# Patient Record
Sex: Female | Born: 1950 | Race: White | Hispanic: No | Marital: Married | Smoking: Former smoker
Health system: Southern US, Community
[De-identification: ages and names within clinical notes are randomized; demographics above are authoritative.]

## PROBLEM LIST (undated history)

## (undated) DIAGNOSIS — H04123 Dry eye syndrome of bilateral lacrimal glands: Secondary | ICD-10-CM

## (undated) DIAGNOSIS — T7840XA Allergy, unspecified, initial encounter: Secondary | ICD-10-CM

## (undated) DIAGNOSIS — H269 Unspecified cataract: Secondary | ICD-10-CM

## (undated) DIAGNOSIS — I10 Essential (primary) hypertension: Secondary | ICD-10-CM

## (undated) HISTORY — DX: Dry eye syndrome of bilateral lacrimal glands: H04.123

## (undated) HISTORY — PX: OTHER SURGICAL HISTORY: SHX169

## (undated) HISTORY — DX: Unspecified cataract: H26.9

## (undated) HISTORY — DX: Essential (primary) hypertension: I10

## (undated) HISTORY — DX: Allergy, unspecified, initial encounter: T78.40XA

---

## 2002-01-27 ENCOUNTER — Other Ambulatory Visit: Admission: RE | Admit: 2002-01-27 | Discharge: 2002-01-27 | Payer: Self-pay | Admitting: Obstetrics and Gynecology

## 2003-12-22 ENCOUNTER — Other Ambulatory Visit: Admission: RE | Admit: 2003-12-22 | Discharge: 2003-12-22 | Payer: Self-pay | Admitting: Obstetrics and Gynecology

## 2004-01-25 ENCOUNTER — Encounter: Admission: RE | Admit: 2004-01-25 | Discharge: 2004-01-25 | Payer: Self-pay | Admitting: Obstetrics and Gynecology

## 2004-02-19 ENCOUNTER — Encounter: Admission: RE | Admit: 2004-02-19 | Discharge: 2004-02-19 | Payer: Self-pay | Admitting: Obstetrics and Gynecology

## 2004-09-12 ENCOUNTER — Encounter: Admission: RE | Admit: 2004-09-12 | Discharge: 2004-09-12 | Payer: Self-pay | Admitting: Obstetrics and Gynecology

## 2004-09-21 ENCOUNTER — Ambulatory Visit: Payer: Self-pay | Admitting: Internal Medicine

## 2005-10-02 ENCOUNTER — Encounter: Admission: RE | Admit: 2005-10-02 | Discharge: 2005-10-02 | Payer: Self-pay | Admitting: *Deleted

## 2005-10-17 ENCOUNTER — Ambulatory Visit: Payer: Self-pay | Admitting: Internal Medicine

## 2006-10-22 ENCOUNTER — Encounter: Admission: RE | Admit: 2006-10-22 | Discharge: 2006-10-22 | Payer: Self-pay | Admitting: Obstetrics and Gynecology

## 2010-02-09 ENCOUNTER — Encounter: Admission: RE | Admit: 2010-02-09 | Discharge: 2010-02-09 | Payer: Self-pay | Admitting: Obstetrics and Gynecology

## 2011-04-21 ENCOUNTER — Other Ambulatory Visit: Payer: Self-pay | Admitting: Obstetrics and Gynecology

## 2011-04-21 DIAGNOSIS — Z780 Asymptomatic menopausal state: Secondary | ICD-10-CM

## 2011-08-06 LAB — HM COLONOSCOPY

## 2012-04-08 ENCOUNTER — Other Ambulatory Visit: Payer: Self-pay | Admitting: Obstetrics and Gynecology

## 2012-04-08 DIAGNOSIS — Z1231 Encounter for screening mammogram for malignant neoplasm of breast: Secondary | ICD-10-CM

## 2012-04-21 LAB — HM MAMMOGRAPHY

## 2012-04-26 ENCOUNTER — Other Ambulatory Visit: Payer: Self-pay | Admitting: Obstetrics and Gynecology

## 2012-04-26 DIAGNOSIS — M810 Age-related osteoporosis without current pathological fracture: Secondary | ICD-10-CM

## 2012-05-22 ENCOUNTER — Ambulatory Visit
Admission: RE | Admit: 2012-05-22 | Discharge: 2012-05-22 | Disposition: A | Discharge status: Home / Self Care | Payer: BC Managed Care – PPO | Source: Ambulatory Visit | Attending: Obstetrics and Gynecology | Admitting: Obstetrics and Gynecology

## 2012-05-22 DIAGNOSIS — Z1231 Encounter for screening mammogram for malignant neoplasm of breast: Secondary | ICD-10-CM

## 2012-05-22 DIAGNOSIS — M810 Age-related osteoporosis without current pathological fracture: Secondary | ICD-10-CM

## 2012-06-21 DIAGNOSIS — I10 Essential (primary) hypertension: Secondary | ICD-10-CM

## 2012-06-21 HISTORY — DX: Essential (primary) hypertension: I10

## 2012-07-12 ENCOUNTER — Encounter: Payer: Self-pay | Admitting: Family Medicine

## 2012-07-12 ENCOUNTER — Ambulatory Visit (INDEPENDENT_AMBULATORY_CARE_PROVIDER_SITE_OTHER): Payer: BC Managed Care – PPO | Admitting: Family Medicine

## 2012-07-12 VITALS — BP 138/90 | HR 78 | Temp 98.0°F | Ht 66.0 in | Wt 162.0 lb

## 2012-07-12 DIAGNOSIS — Z0184 Encounter for antibody response examination: Secondary | ICD-10-CM | POA: Insufficient documentation

## 2012-07-12 DIAGNOSIS — I10 Essential (primary) hypertension: Secondary | ICD-10-CM | POA: Insufficient documentation

## 2012-07-12 DIAGNOSIS — IMO0001 Reserved for inherently not codable concepts without codable children: Secondary | ICD-10-CM

## 2012-07-12 DIAGNOSIS — R030 Elevated blood-pressure reading, without diagnosis of hypertension: Secondary | ICD-10-CM

## 2012-07-12 NOTE — Progress Notes (Signed)
  Subjective:    Patient ID: Elaine Lambert, female    DOB: 1951/03/31, 61 y.o.   MRN: [id number]  HPI New to establish.  GYN- Romine.  Last had labs 2 yrs ago.  UTD on mammo, pap.  Has upcoming colonoscopy on 08/05/12.  Elevated BP- pt reports BP was 140/70 last week at GI office.  Denies any hx of HTN.  No family hx of HTN.  No CP, SOB, HAs, visual changes, edema.  Had chicken biscuit this AM.  Shingles vaccine- pt is not sure of her varicella status.   Review of Systems For ROS see HPI     Objective:   Physical Exam  Vitals reviewed. Constitutional: She is oriented to person, place, and time. She appears well-developed and well-nourished. No distress.  HENT:  Head: Normocephalic and atraumatic.  Eyes: Conjunctivae normal and EOM are normal. Pupils are equal, round, and reactive to light.  Neck: Normal range of motion. Neck supple. No thyromegaly present.  Cardiovascular: Normal rate, regular rhythm, normal heart sounds and intact distal pulses.   No murmur heard. Pulmonary/Chest: Effort normal and breath sounds normal. No respiratory distress.  Abdominal: Soft. She exhibits no distension. There is no tenderness.  Musculoskeletal: She exhibits no edema.  Lymphadenopathy:    She has no cervical adenopathy.  Neurological: She is alert and oriented to person, place, and time.  Skin: Skin is warm and dry.  Psychiatric: She has a normal mood and affect. Her behavior is normal.          Assessment & Plan:

## 2012-07-12 NOTE — Assessment & Plan Note (Signed)
New.  Pt w/ no hx of similar.  No family hx.  Asymptomatic.  Will watch BP closely and determine whether meds are required.  Reviewed supportive care and red flags that should prompt return.  Pt expressed understanding and is in agreement w/ plan.

## 2012-07-12 NOTE — Assessment & Plan Note (Signed)
Check Varicellla titers to determine whether pt requires shingles or varicella vaccine.  Pt expressed understanding and is in agreement w/ plan.

## 2012-07-12 NOTE — Patient Instructions (Addendum)
Schedule a nurse visit in 2 weeks to recheck BP We'll notify you of your lab results and determine which vaccine you need Call with any questions or concerns Think of [place] as your home base Welcome!  We're glad to have you! Happy Thanksgiving!

## 2012-07-26 ENCOUNTER — Telehealth: Payer: Self-pay

## 2012-07-26 ENCOUNTER — Ambulatory Visit: Payer: BC Managed Care – PPO

## 2012-07-26 NOTE — Telephone Encounter (Signed)
Advise patient, please schedule a followup with PCP, return immediately if she has headaches, nausea, chest pain, shortness of breath or lower extremity edema.

## 2012-07-26 NOTE — Telephone Encounter (Signed)
LMOVM on number pt [phone] to verify pt pharmacy and advise info. Per Tabori. Awaiting pt call.    MW

## 2012-07-26 NOTE — Telephone Encounter (Signed)
Message copied by Mal Amabile on Fri Jul 26, 2012 10:24 AM ------      Message from: Sheliah Hatch      Created: Fri Jul 26, 2012 10:14 AM       Pt needs a nurse visit encounter, a level 1 charge, and needs to start HCTZ 12.5 mg daily w/ follow up in 1 month w/ me.  Selena Batten- please show Hilda Lias how to do a nurse visit if she doesn't know how.  Not sure she has done one previously.            Thanks      KT      ----- Message -----         From: Mal Amabile, MA         Sent: 07/26/2012  10:07 AM           To: Sheliah Hatch, MD            Pt is here for BP check on nurses visit. B/P 150/98. PLz advise what I need to do next and if you want pt charged for it.     MW

## 2012-07-26 NOTE — Telephone Encounter (Signed)
This patient has an apt scheduled on Monday with Dr.Tabori and Dr.Tabori is aware.     KP

## 2012-07-26 NOTE — Telephone Encounter (Signed)
Spoke to pt and pt refusing BP meds at this time advises she is under a lot of stress and will try to eliminate some of that and see if that helps BP.  Pt needs office visit with Tabori to discuss BP. PLz advise pt CB# [phone]

## 2012-07-29 ENCOUNTER — Encounter: Payer: Self-pay | Admitting: Family Medicine

## 2012-07-29 ENCOUNTER — Ambulatory Visit (INDEPENDENT_AMBULATORY_CARE_PROVIDER_SITE_OTHER): Payer: BC Managed Care – PPO | Admitting: Family Medicine

## 2012-07-29 VITALS — BP 158/90 | HR 72 | Temp 98.3°F | Ht 66.25 in | Wt 162.8 lb

## 2012-07-29 DIAGNOSIS — R030 Elevated blood-pressure reading, without diagnosis of hypertension: Secondary | ICD-10-CM

## 2012-07-29 DIAGNOSIS — IMO0001 Reserved for inherently not codable concepts without codable children: Secondary | ICD-10-CM

## 2012-07-29 DIAGNOSIS — Z2911 Encounter for prophylactic immunotherapy for respiratory syncytial virus (RSV): Secondary | ICD-10-CM

## 2012-07-29 DIAGNOSIS — Z23 Encounter for immunization: Secondary | ICD-10-CM

## 2012-07-29 MED ORDER — HYDROCHLOROTHIAZIDE 12.5 MG PO CAPS: 12.5000 mg | ORAL_CAPSULE | Freq: Every day | ORAL | Status: DC

## 2012-07-29 NOTE — Progress Notes (Signed)
  Subjective:    Patient ID: Elaine Lambert, female    DOB: 07-13-1951, 61 y.o.   MRN: [id number]  HPI HTN- new dx.  + family hx- mom.  Mom stopped taking meds due to side effects.  Pt feels this is stress related.  No CP.  Will have mild SOB when 'upset about something'.  No HAs, visual changes.  Pt hesitant to take meds due to mom's side effects- pt not aware of what med mom was on.  Pt not following low salt diet.  Varicella titers- indicate pt has had past infection w/ varicella, desires shingles shot.   Review of Systems For ROS see HPI     Objective:   Physical Exam  Vitals reviewed. Constitutional: She is oriented to person, place, and time. She appears well-developed and well-nourished. No distress.  HENT:  Head: Normocephalic and atraumatic.  Eyes: Conjunctivae normal and EOM are normal. Pupils are equal, round, and reactive to light.  Neck: Normal range of motion. Neck supple. No thyromegaly present.  Cardiovascular: Normal rate, regular rhythm, normal heart sounds and intact distal pulses.   No murmur heard. Pulmonary/Chest: Effort normal and breath sounds normal. No respiratory distress.  Abdominal: Soft. She exhibits no distension. There is no tenderness.  Musculoskeletal: She exhibits no edema.  Lymphadenopathy:    She has no cervical adenopathy.  Neurological: She is alert and oriented to person, place, and time.  Skin: Skin is warm and dry.  Psychiatric: She has a normal mood and affect. Her behavior is normal.          Assessment & Plan:

## 2012-07-29 NOTE — Patient Instructions (Addendum)
Follow up in 1 month to recheck blood pressure and potassium Increase your dietary intake of potassium in the form of leafy greens, bananas, citrus fruits Try and find a stress outlet! Limit your salt intake Call with any questions or concerns Happy Holidays!!!

## 2012-07-29 NOTE — Assessment & Plan Note (Signed)
New dx.  Based on pt's hx of elevated BP on multiple occasions she is officially hypertensive.  Discussed this w/ pt and need for BP control to avoid long term complications.  Pt agreeable.  Will start low dose HCTZ and follow closely.

## 2012-08-30 ENCOUNTER — Ambulatory Visit (INDEPENDENT_AMBULATORY_CARE_PROVIDER_SITE_OTHER): Payer: BC Managed Care – PPO | Admitting: Family Medicine

## 2012-08-30 ENCOUNTER — Encounter: Payer: Self-pay | Admitting: Family Medicine

## 2012-08-30 VITALS — BP 110/80 | HR 82 | Temp 98.2°F | Ht 66.5 in | Wt 162.8 lb

## 2012-08-30 DIAGNOSIS — I10 Essential (primary) hypertension: Secondary | ICD-10-CM

## 2012-08-30 LAB — BASIC METABOLIC PANEL
BUN: 14 mg/dL (ref 6–23)
Calcium: 9.4 mg/dL (ref 8.4–10.5)
GFR: 84.1 mL/min (ref >60.00)
Glucose, Bld: 106 mg/dL — ABNORMAL HIGH (ref 70–99)
Potassium: 3.7 mEq/L (ref 3.5–5.1)
Sodium: 137 mEq/L (ref 135–145)

## 2012-08-30 NOTE — Patient Instructions (Addendum)
Schedule your complete physical in 3 months Keep up the good work- you look great! Call with any questions or concerns Happy New Year!

## 2012-08-30 NOTE — Progress Notes (Signed)
  Subjective:    Patient ID: Elaine Lambert, female    DOB: [birth year], 62 y.o.   MRN: [id number]  HPI HTN- relatively new dx for pt.  Started on HCTZ last visit.  SBP has decreased by 10 pts.  Had colonoscopy f/u at [facility] 2 weeks ago and BP was 128/82.  No CP, SOB, HAs, visual changes, edema.   Review of Systems For ROS see HPI     Objective:   Physical Exam  Vitals reviewed. Constitutional: She is oriented to person, place, and time. She appears well-developed and well-nourished. No distress.  HENT:  Head: Normocephalic and atraumatic.  Eyes: Conjunctivae normal and EOM are normal. Pupils are equal, round, and reactive to light.  Neck: Normal range of motion. Neck supple. No thyromegaly present.  Cardiovascular: Normal rate, regular rhythm, normal heart sounds and intact distal pulses.   No murmur heard. Pulmonary/Chest: Effort normal and breath sounds normal. No respiratory distress.  Abdominal: Soft. She exhibits no distension. There is no tenderness.  Musculoskeletal: She exhibits no edema.  Lymphadenopathy:    She has no cervical adenopathy.  Neurological: She is alert and oriented to person, place, and time.  Skin: Skin is warm and dry.  Psychiatric: She has a normal mood and affect. Her behavior is normal.          Assessment & Plan:

## 2012-08-30 NOTE — Assessment & Plan Note (Signed)
BP much improved since last visit.  Check BMP.  Continue HCTZ.  Pt expressed understanding and is in agreement w/ plan.

## 2012-11-10 ENCOUNTER — Encounter: Payer: Self-pay | Admitting: Family Medicine

## 2012-11-11 ENCOUNTER — Ambulatory Visit (INDEPENDENT_AMBULATORY_CARE_PROVIDER_SITE_OTHER): Payer: BC Managed Care – PPO | Admitting: Family Medicine

## 2012-11-11 ENCOUNTER — Encounter: Payer: Self-pay | Admitting: Family Medicine

## 2012-11-11 VITALS — BP 120/78 | HR 80 | Temp 98.3°F | Ht 66.5 in | Wt 164.8 lb

## 2012-11-11 DIAGNOSIS — L03319 Cellulitis of trunk, unspecified: Secondary | ICD-10-CM

## 2012-11-11 DIAGNOSIS — L03313 Cellulitis of chest wall: Secondary | ICD-10-CM

## 2012-11-11 DIAGNOSIS — L02219 Cutaneous abscess of trunk, unspecified: Secondary | ICD-10-CM

## 2012-11-11 DIAGNOSIS — IMO0002 Reserved for concepts with insufficient information to code with codable children: Secondary | ICD-10-CM | POA: Insufficient documentation

## 2012-11-11 MED ORDER — DOXYCYCLINE HYCLATE 100 MG PO TABS: 100.0000 mg | ORAL_TABLET | Freq: Two times a day (BID) | ORAL | Status: DC

## 2012-11-11 NOTE — Progress Notes (Signed)
  Subjective:    Patient ID: Elaine Lambert, female    DOB: 02/08/1951, 63 y.o.   MRN: [id number]  HPI Lump on chest- pt reports she had a 'swollen gland' in the center of the chest 'for a long time' but as of Saturday, area has enlarged, become painful and red/hot.  No fevers.  No drainage from chest.   Review of Systems For ROS see HPI     Objective:   Physical Exam  Vitals reviewed. Constitutional: She appears well-developed and well-nourished. No distress.  Skin: Skin is warm and dry. There is erythema (large area of erythema starting over mid sternum between breasts and extending L to encompass ~ 4 inches of L breast.  warm to touch, induration just L of sternum w/out fluctuance or drainage).          Assessment & Plan:

## 2012-11-11 NOTE — Telephone Encounter (Signed)
Patient was seen today.

## 2012-11-11 NOTE — Patient Instructions (Addendum)
This is called cellulitis (infection) Apply hot compresses to the area Take the Doxy twice daily- take w/ food If no improvement in the next few days or worsening- please call!! Hang in there!

## 2012-11-11 NOTE — Assessment & Plan Note (Signed)
New.  Pt w/out drainable fluid collection.  Start abx.  Take w/ food.  Reviewed supportive care and red flags that should prompt return.  Pt expressed understanding and is in agreement w/ plan.

## 2012-11-13 ENCOUNTER — Encounter: Payer: Self-pay | Admitting: Family Medicine

## 2012-11-13 ENCOUNTER — Telehealth: Payer: Self-pay | Admitting: Family Medicine

## 2012-11-13 NOTE — Telephone Encounter (Signed)
Patient Information:  Caller Name: Joscelyne  Phone: [phone]  Patient: Elaine Lambert, Elaine Lambert  Gender: Female  DOB: 12/24/1950  Age: 62 Years  PCP: Sheliah Hatch.  Office Follow Up:  Does the office need to follow up with this patient?: No  Instructions For The Office: N/A  RN Note:  Swollen area in center of chest is not as red. Remains tender. Is taking Tylenol as diected. Advised return if pain worsening or not improved after 48hours on antibiotic. Wants to know if should change to saline soaks and advised to follow MD advice and use warm compresses. Will call back 3-27 if not improved or worsening.  Symptoms  Reason For Call & Symptoms: Was seen in office 3-24 and diagnosed with swollen "gland" in center of chest. Area has opened and is draining  "clearish brown" and has small amount of blood in drainage.  Reviewed Health History In EMR: Yes  Reviewed Medications In EMR: Yes  Reviewed Allergies In EMR: Yes  Reviewed Surgeries / Procedures: Yes  Date of Onset of Symptoms: 11/13/2012  Guideline(s) Used:  Skin Lesion - Moles or Growths  Lymph Nodes - Swollen  Disposition Per Guideline:   See Today or Tomorrow in Office  Reason For Disposition Reached:   Very tender to the touch but no fever  Advice Given:  Call Back If:  You become worse or are worried about a lymph node.  Patient Will Follow Care Advice:  YES

## 2012-11-13 NOTE — Telephone Encounter (Signed)
Drainage is good!  Less redness is good!  These things all sound promising

## 2012-11-13 NOTE — Telephone Encounter (Signed)
To MD for review     KP 

## 2012-11-15 ENCOUNTER — Telehealth (INDEPENDENT_AMBULATORY_CARE_PROVIDER_SITE_OTHER): Payer: Self-pay

## 2012-11-15 ENCOUNTER — Encounter (INDEPENDENT_AMBULATORY_CARE_PROVIDER_SITE_OTHER): Payer: Self-pay

## 2012-11-15 ENCOUNTER — Ambulatory Visit (INDEPENDENT_AMBULATORY_CARE_PROVIDER_SITE_OTHER): Payer: BC Managed Care – PPO | Admitting: General Surgery

## 2012-11-15 ENCOUNTER — Encounter: Payer: Self-pay | Admitting: Family Medicine

## 2012-11-15 ENCOUNTER — Telehealth (INDEPENDENT_AMBULATORY_CARE_PROVIDER_SITE_OTHER): Payer: Self-pay | Admitting: *Deleted

## 2012-11-15 ENCOUNTER — Ambulatory Visit (INDEPENDENT_AMBULATORY_CARE_PROVIDER_SITE_OTHER): Payer: BC Managed Care – PPO | Admitting: Family Medicine

## 2012-11-15 ENCOUNTER — Encounter (INDEPENDENT_AMBULATORY_CARE_PROVIDER_SITE_OTHER): Payer: Self-pay | Admitting: General Surgery

## 2012-11-15 VITALS — BP 126/80 | HR 77 | Temp 98.2°F | Ht 66.5 in | Wt 163.4 lb

## 2012-11-15 VITALS — BP 118/72 | HR 68 | Temp 98.3°F | Resp 18 | Ht 66.5 in | Wt 163.0 lb

## 2012-11-15 DIAGNOSIS — L03313 Cellulitis of chest wall: Secondary | ICD-10-CM

## 2012-11-15 DIAGNOSIS — N61 Mastitis without abscess: Secondary | ICD-10-CM

## 2012-11-15 DIAGNOSIS — L02219 Cutaneous abscess of trunk, unspecified: Secondary | ICD-10-CM

## 2012-11-15 HISTORY — PX: INCISION AND DRAINAGE BREAST ABSCESS: SUR672

## 2012-11-15 MED ORDER — DOXYCYCLINE HYCLATE 100 MG PO TABS: 100.0000 mg | ORAL_TABLET | Freq: Two times a day (BID) | ORAL | Status: DC

## 2012-11-15 NOTE — Assessment & Plan Note (Addendum)
Deteriorated.  Pt w/ 4-5 inches of erythema and induration w/ 2.5 inches of central peeling skin, necrotic center, and copious foul smelling drainage despite ongoing Doxy.  Skin has some orange skin like changes- must r/o breast cancer variant.  Referral made for urgent surgical evaluation.

## 2012-11-15 NOTE — Telephone Encounter (Signed)
Rx for Norco 5/325 #30 w/ no refills called to [facility] in [place], [place]/[address] [place] location.  Pt is aware.

## 2012-11-15 NOTE — Telephone Encounter (Signed)
Pt was seen in the office today by Dr. Beverely Low to follow-up on the Cellulitis of the chest wall.//AB/CMA

## 2012-11-15 NOTE — Telephone Encounter (Signed)
Urgent office appt made per Dr. Beverely Low.

## 2012-11-15 NOTE — Progress Notes (Signed)
  Subjective:    Patient ID: Elaine Lambert, female    DOB: 1951/02/12, 62 y.o.   MRN: [id number]  HPI Breast abscess- L, seen on Monday and started on Doxy for cellulitis.  No abscess at that time.  Now area is more tender, draining- foul smelling green and brown, and pt woke w/ black central area.  No fevers.  Taking Doxy as directed.   Review of Systems For ROS see HPI     Objective:   Physical Exam  Vitals reviewed. Constitutional: She appears well-developed and well-nourished.  Obviously uncomfortable  Skin: Skin is warm. There is erythema (pt w/ 4-5 inches of erythema extending from sternum across L breast, 2.5 inch area of peeling, wet, draining skin w/ central area of necrosis.  gentle pressure applied to indurated area and copious drainage expressed- brownish, green, extremely foul odor.).  As much drainage expressed as possible in office w/out making incision          Assessment & Plan:

## 2012-11-15 NOTE — Patient Instructions (Signed)
Remove bandage and packing on Sunday. Clean area with warm water twice a day and apply a heavy dressing. If you have any problems this weekend, go to the emergency department.

## 2012-11-15 NOTE — Progress Notes (Signed)
Patient ID: Mairim Bade, female   DOB: 03/19/1951, 63 y.o.   MRN: [id number]  Chief Complaint  Patient presents with  . Other    Eval breast abscess    HPI Toshia Larkin is a 62 y.o. female.   HPI  She is referred by Dr. Beverely Low for further evaluation of a left breast abscess.  About 3-4 months ago she noticed a small bump in the medial aspect of the left breast. She pressed on this and had some drainage from this, and it improved. One month ago it recurred. About 6 days ago she noted a coming back and redness started streaking from medial to lateral on the breast. She saw her primary care physician 4 days ago and was started on doxycycline. Wednesday he began spontaneously draining and slightly improving. It has continued to drain.  Past Medical History  Diagnosis Date  . Hypertension     History reviewed. No pertinent past surgical history.  Family History  Problem Relation Age of Onset  . Cancer Mother     SINUS  . Parkinson's disease Mother   . Lung cancer Father   . Liver cancer Father   . Testicular cancer Brother     Social History History  Substance Use Topics  . Smoking status: Never Smoker   . Smokeless tobacco: Not on file  . Alcohol Use: No    No Known Allergies  Current Outpatient Prescriptions  Medication Sig Dispense Refill  . doxycycline (VIBRA-TABS) 100 MG tablet Take 1 tablet (100 mg total) by mouth 2 (two) times daily.  20 tablet  0  . hydrochlorothiazide (MICROZIDE) 12.5 MG capsule Take 1 capsule (12.5 mg total) by mouth daily.  30 capsule  3   No current facility-administered medications for this visit.    Review of Systems Review of Systems  Constitutional: Negative for fever and chills.  Respiratory:       Less redness on the breast now.    Blood pressure 118/72, pulse 68, temperature 98.3 F (36.8 C), temperature source Temporal, resp. rate 18, height 5' 6.5" (1.689 m), weight 163 lb (73.936 kg).  Physical Exam Physical Exam   Constitutional: She appears well-developed and well-nourished. No distress.  Pulmonary/Chest:  Left breast demonstrates erythema over the medial half. There is some necrotic skin adjacent to the sternal area with a scab present. Induration is present around this area as well.    Data Reviewed Dr. Rennis Golden note  Assessment    Incompletely drained left breast abscess with cellulitis.     Plan    Incision and drainage here in the office.  The medial aspect of the left breast was sterilely prepped and draped. The area of induration was anesthetized with Xylocaine. A circular incision was made through some necrotic skin and some purulent fluid evacuated. Necrotic tissue was sharply debrided. The wound was then packed tightly with iodoform gauze. A bulky dressing was applied. She tolerated the procedure well.  She was instructed to remove the packing on Sunday and clean the wound twice daily with warm water and apply a bulky dressing. She is to continue the doxycycline. Return visit one week.        Tiquan Bouch J 11/15/2012, 4:38 PM

## 2012-11-20 ENCOUNTER — Telehealth: Payer: Self-pay | Admitting: Family Medicine

## 2012-11-20 ENCOUNTER — Ambulatory Visit (INDEPENDENT_AMBULATORY_CARE_PROVIDER_SITE_OTHER): Payer: BC Managed Care – PPO | Admitting: General Surgery

## 2012-11-20 ENCOUNTER — Encounter (INDEPENDENT_AMBULATORY_CARE_PROVIDER_SITE_OTHER): Payer: Self-pay | Admitting: General Surgery

## 2012-11-20 VITALS — BP 130/80 | HR 72 | Temp 97.6°F | Resp 18 | Ht 66.5 in | Wt 165.4 lb

## 2012-11-20 DIAGNOSIS — Z9889 Other specified postprocedural states: Secondary | ICD-10-CM

## 2012-11-20 NOTE — Telephone Encounter (Signed)
Refill: Hydrochlorothiazide 12.5 mg capsules. Take one capsule by mouth every day. Qty 30. Last fill 10-22-12

## 2012-11-20 NOTE — Progress Notes (Signed)
She is here for her first visit after incision and drainage of left breast abscess. She feels much better. She says the redness is about gone.  On exam, the left breast medial wound is clean. The erythema is nearly completely resolved. No induration.  Left breast abscess status post incision and drainage-significantly improved.  Plan: Continue current wound care. Continued antibiotics until they are gone. Return visit 3 weeks.

## 2012-11-20 NOTE — Patient Instructions (Signed)
Continue current wound care.  Call if the redness returns.

## 2012-11-21 MED ORDER — HYDROCHLOROTHIAZIDE 12.5 MG PO CAPS: 12.5000 mg | ORAL_CAPSULE | Freq: Every day | ORAL | Status: DC

## 2012-11-21 NOTE — Telephone Encounter (Signed)
Refill done.  

## 2012-12-02 ENCOUNTER — Ambulatory Visit (INDEPENDENT_AMBULATORY_CARE_PROVIDER_SITE_OTHER): Payer: BC Managed Care – PPO | Admitting: Family Medicine

## 2012-12-02 ENCOUNTER — Encounter: Payer: Self-pay | Admitting: Family Medicine

## 2012-12-02 VITALS — BP 138/80 | HR 64 | Temp 98.3°F | Ht 65.75 in | Wt 164.2 lb

## 2012-12-02 DIAGNOSIS — Z0000 Encounter for general adult medical examination without abnormal findings: Secondary | ICD-10-CM

## 2012-12-02 LAB — HEPATIC FUNCTION PANEL
ALT: 15 U/L (ref 0–35)
AST: 18 U/L (ref 0–37)
Albumin: 4.1 g/dL (ref 3.5–5.2)
Alkaline Phosphatase: 61 U/L (ref 39–117)
Bilirubin, Direct: 0.1 mg/dL (ref 0.0–0.3)
Total Protein: 7.1 g/dL (ref 6.0–8.3)

## 2012-12-02 LAB — BASIC METABOLIC PANEL
CO2: 25 mEq/L (ref 19–32)
Chloride: 104 mEq/L (ref 96–112)
Glucose, Bld: 106 mg/dL — ABNORMAL HIGH (ref 70–99)
Potassium: 3.9 mEq/L (ref 3.5–5.1)
Sodium: 137 mEq/L (ref 135–145)

## 2012-12-02 LAB — CBC WITH DIFFERENTIAL/PLATELET
Basophils Absolute: 0 10*3/uL (ref 0.0–0.1)
Eosinophils Relative: 4.6 % (ref 0.0–5.0)
HCT: 40.8 % (ref 36.0–46.0)
Lymphocytes Relative: 24.6 % (ref 12.0–46.0)
Lymphs Abs: 1.4 10*3/uL (ref 0.7–4.0)
Monocytes Relative: 7.2 % (ref 3.0–12.0)
Neutrophils Relative %: 62.7 % (ref 43.0–77.0)
Platelets: 215 10*3/uL (ref 150.0–400.0)
RDW: 12.6 % (ref 11.5–14.6)
WBC: 5.7 10*3/uL (ref 4.5–10.5)

## 2012-12-02 LAB — LIPID PANEL
LDL Cholesterol: 120 mg/dL — ABNORMAL HIGH (ref 0–99)
Total CHOL/HDL Ratio: 3
Triglycerides: 71 mg/dL (ref 0.0–149.0)

## 2012-12-02 NOTE — Assessment & Plan Note (Signed)
Pt's PE WNL.  UTD on health maintenance.  Check labs.  Anticipatory guidance provided.  

## 2012-12-02 NOTE — Progress Notes (Signed)
  Subjective:    Patient ID: Elaine Lambert, female    DOB: [birth year], 62 y.o.   MRN: [id number]  HPI CPE- UTD on health maintenance.  No concerns today.   Review of Systems Patient reports no vision/ hearing changes, adenopathy,fever, weight change,  persistant/recurrent hoarseness , swallowing issues, chest pain, palpitations, edema, persistant/recurrent cough, hemoptysis, dyspnea (rest/exertional/paroxysmal nocturnal), gastrointestinal bleeding (melena, rectal bleeding), abdominal pain, significant heartburn, bowel changes, GU symptoms (dysuria, hematuria, incontinence), Gyn symptoms (abnormal  bleeding, pain),  syncope, focal weakness, memory loss, numbness & tingling, skin/hair/nail changes, abnormal bruising or bleeding, anxiety, or depression.     Objective:   Physical Exam General Appearance:    Alert, cooperative, no distress, appears stated age  Head:    Normocephalic, without obvious abnormality, atraumatic  Eyes:    PERRL, conjunctiva/corneas clear, EOM's intact, fundi    benign, both eyes  Ears:    Normal TM's and external ear canals, both ears  Nose:   Nares normal, septum midline, mucosa normal, no drainage    or sinus tenderness  Throat:   Lips, mucosa, and tongue normal; teeth and gums normal  Neck:   Supple, symmetrical, trachea midline, no adenopathy;    Thyroid: no enlargement/tenderness/nodules  Back:     Symmetric, no curvature, ROM normal, no CVA tenderness  Lungs:     Clear to auscultation bilaterally, respirations unlabored  Chest Wall:    No tenderness or deformity   Heart:    Regular rate and rhythm, S1 and S2 normal, no murmur, rub   or gallop  Breast Exam:    Deferred to GYN  Abdomen:     Soft, non-tender, bowel sounds active all four quadrants,    no masses, no organomegaly  Genitalia:    Deferred to GYN  Rectal:    Extremities:   Extremities normal, atraumatic, no cyanosis or edema  Pulses:   2+ and symmetric all extremities  Skin:   Skin color,  texture, turgor normal, no rashes or lesions.  Well healing chest wall abscess  Lymph nodes:   Cervical, supraclavicular, and axillary nodes normal  Neurologic:   CNII-XII intact, normal strength, sensation and reflexes    throughout          Assessment & Plan:

## 2012-12-02 NOTE — Patient Instructions (Addendum)
Follow up in 6 months to recheck BP We'll notify you of your lab results and make any changes if needed Keep up the good work!  You look great! Call with any questions or concerns Happy Spring! 

## 2012-12-06 LAB — VITAMIN D 1,25 DIHYDROXY
Vitamin D 1, 25 (OH)2 Total: 58 pg/mL (ref 18–72)
Vitamin D2 1, 25 (OH)2: <8 pg/mL

## 2012-12-09 ENCOUNTER — Encounter (INDEPENDENT_AMBULATORY_CARE_PROVIDER_SITE_OTHER): Payer: Self-pay | Admitting: General Surgery

## 2012-12-09 ENCOUNTER — Ambulatory Visit (INDEPENDENT_AMBULATORY_CARE_PROVIDER_SITE_OTHER): Payer: BC Managed Care – PPO | Admitting: General Surgery

## 2012-12-09 VITALS — BP 130/88 | HR 79 | Temp 97.2°F | Ht 66.0 in | Wt 164.2 lb

## 2012-12-09 DIAGNOSIS — N611 Abscess of the breast and nipple: Secondary | ICD-10-CM

## 2012-12-09 DIAGNOSIS — N61 Mastitis without abscess: Secondary | ICD-10-CM

## 2012-12-09 NOTE — Patient Instructions (Signed)
If the wound has not healed in one month please call and let us know.

## 2012-12-09 NOTE — Progress Notes (Signed)
She is here for her second visit after incision and drainage of left breast abscess.  She is doing well.  On exam, the left breast medial wound is almost completely healed.  No erythema or induration.  Left breast abscess status post incision and drainage-wound almost completely healed.  Plan:  Apply a dry bandage daily.  Return as needed if wound fails to completely heal.

## 2013-01-24 ENCOUNTER — Encounter: Payer: Self-pay | Admitting: Family Medicine

## 2013-01-24 ENCOUNTER — Ambulatory Visit (INDEPENDENT_AMBULATORY_CARE_PROVIDER_SITE_OTHER): Payer: BC Managed Care – PPO | Admitting: Family Medicine

## 2013-01-24 VITALS — BP 138/78 | HR 77 | Temp 98.5°F | Ht 65.75 in | Wt 164.4 lb

## 2013-01-24 DIAGNOSIS — S0180XA Unspecified open wound of other part of head, initial encounter: Secondary | ICD-10-CM

## 2013-01-24 DIAGNOSIS — K120 Recurrent oral aphthae: Secondary | ICD-10-CM

## 2013-01-24 DIAGNOSIS — S0181XA Laceration without foreign body of other part of head, initial encounter: Secondary | ICD-10-CM | POA: Insufficient documentation

## 2013-01-24 NOTE — Assessment & Plan Note (Signed)
New to provider, sutures have approximated wound edges very well and area is healing.  Sutures removed w/out difficulty.  Encouraged pt to continue neosporin and avoid sun exposure.  Reviewed supportive care and red flags that should prompt return.  Pt expressed understanding and is in agreement w/ plan.

## 2013-01-24 NOTE — Patient Instructions (Addendum)
This looks good! Apply Neosporin (polysporin) twice daily If out in the sun, keep cover or use good, high SPF sunscreen (raw skin will burn easily!) Get the aphthous ulcer or canker sore cream and apply as directed Call with any questions or concerns Hang in there!!

## 2013-01-24 NOTE — Progress Notes (Signed)
  Subjective:    Patient ID: Elaine Lambert, female    DOB: 1950-12-23, 62 y.o.   MRN: [id number]  HPI Facial laceration- pt fell off the steps on the deck and her sunglasses caused a laceration above her L eye requiring 11 stitches in the ER.  Also w/ facial abrasion on chin and 2 oral sores from biting her lip.  Was previously applying Neosporin to the abraded areas but stopped after 3 days.  No longer oozing or draining and currently mouth areas are most painful.  Stitches due to be removed today.   Review of Systems For ROS see HPI     Objective:   Physical Exam  Vitals reviewed. Constitutional: She appears well-developed and well-nourished. No distress.  HENT:  2 aphthous ulcers- 1 on bottom lip and one on upper lip where pt bit when she fell.  No evidence of infection  Skin: Skin is warm and dry.  Superficial abrasion on chin w/out evidence of infxn L facial laceration above medial L eye w/out evidence of infxn or drainage, well approximated- 11 sutures in place.  Sutures removed w/out difficulty and wound cleaned w/ H2O2.          Assessment & Plan:

## 2013-01-24 NOTE — Assessment & Plan Note (Signed)
New.  No evidence of infxn.  Encouraged pt to apply OTC paste for pain relief.  Pt expressed understanding and is in agreement w/ plan.

## 2013-04-02 ENCOUNTER — Other Ambulatory Visit: Payer: Self-pay | Admitting: Family Medicine

## 2013-04-02 NOTE — Telephone Encounter (Signed)
Rx sent to the pharmacy by e-script.//AB/CMA 

## 2013-04-28 ENCOUNTER — Ambulatory Visit (INDEPENDENT_AMBULATORY_CARE_PROVIDER_SITE_OTHER): Payer: BC Managed Care – PPO | Admitting: Nurse Practitioner

## 2013-04-28 ENCOUNTER — Encounter: Payer: Self-pay | Admitting: Nurse Practitioner

## 2013-04-28 VITALS — BP 120/84 | HR 64 | Resp 16 | Ht 66.5 in | Wt 165.0 lb

## 2013-04-28 DIAGNOSIS — Z01419 Encounter for gynecological examination (general) (routine) without abnormal findings: Secondary | ICD-10-CM

## 2013-04-28 DIAGNOSIS — Z0000 Encounter for general adult medical examination without abnormal findings: Secondary | ICD-10-CM

## 2013-04-28 LAB — POCT URINALYSIS DIPSTICK
Bilirubin, UA: NEGATIVE
Blood, UA: NEGATIVE
Leukocytes, UA: NEGATIVE
Nitrite, UA: NEGATIVE
Protein, UA: NEGATIVE
pH, UA: 5

## 2013-04-28 NOTE — Progress Notes (Signed)
Patient ID: Elaine Lambert, female   DOB: 21-Feb-1951, 62 y.o.   MRN: [id number] 62 y.o. G52P1001 Married Caucasian Fe here for annual exam.  New diagnosis of HTN last fall.  She also had a cyst between the breast that became an abscess and had to be surgical excised in April. Has done well with that.  No LMP recorded. Patient is postmenopausal.          Sexually active: yes  The current method of family planning is post menopausal status.    Exercising: yes  Home exercise routine includes walking and treadmill. Smoker:  no  Health Maintenance: Pap:  04/26/12, WNL HR HPV neg MMG:  05/22/12, BI-Rads 1: negative  Colonoscopy:  10/2012, [facility], 3 polyps, repeat in 5 years BMD:   05/22/12, normal TDaP:  04/21/11 Labs: HB; 14.4 Urine: negative    reports that she has never smoked. She has never used smokeless tobacco. She reports that she drinks about 0.5 ounces of alcohol per week. She reports that she does not use illicit drugs.  Past Medical History  Diagnosis Date  . Hypertension     Past Surgical History  Procedure Laterality Date  . Incision and drainage breast abscess Left 11/2012    Current Outpatient Prescriptions  Medication Sig Dispense Refill  . hydrochlorothiazide (MICROZIDE) 12.5 MG capsule TAKE 1 CAPSULE BY MOUTH EVERY DAY  30 capsule  2   No current facility-administered medications for this visit.    Family History  Problem Relation Age of Onset  . Cancer Mother     SINUS, retinal  . Parkinson's disease Mother   . Lung cancer Father   . Liver cancer Father   . Testicular cancer Brother   . Prostate cancer Brother     ROS:  Pertinent items are noted in HPI.  Otherwise, a comprehensive ROS was negative.  Exam:   BP 120/84  Pulse 64  Resp 16  Ht 5' 6.5" (1.689 m)  Wt 165 lb (74.844 kg)  BMI 26.24 kg/m2 Height: 5' 6.5" (168.9 cm)  Ht Readings from Last 3 Encounters:  04/28/13 5' 6.5" (1.689 m)  01/24/13 5' 5.75" (1.67 m)  12/09/12 5\' 6"  (1.676 m)     General appearance: alert, cooperative and appears stated age Head: Normocephalic, without obvious abnormality, atraumatic Neck: no adenopathy, supple, symmetrical, trachea midline and thyroid normal to inspection and palpation Lungs: clear to auscultation bilaterally Breasts: normal appearance, no masses or tenderness Heart: regular rate and rhythm Abdomen: soft, non-tender; no masses,  no organomegaly Extremities: extremities normal, atraumatic, no cyanosis or edema Skin: Skin color, texture, turgor normal. No rashes or lesions Lymph nodes: Cervical, supraclavicular, and axillary nodes normal. No abnormal inguinal nodes palpated Neurologic: Grossly normal   Pelvic: External genitalia:  no lesions              Urethra:  normal appearing urethra with no masses, tenderness or lesions              Bartholin's and Skene's: normal                 Vagina: normal appearing vagina with normal color and discharge, no lesions              Cervix: anteverted              Pap taken: no Bimanual Exam:  Uterus:  normal size, contour, position, consistency, mobility, non-tender  Adnexa: no mass, fullness, tenderness               Rectovaginal: Confirms               Anus:  normal sphincter tone, no lesions  A:  Well Woman with normal exam  Postmenopausal no HRT  History of Colonic polyps 2014  New diagnosis of HTN  P:   Pap smear as per guidelines  not done  Mammogram due 10/14  Counseled on breast self exam, adequate intake of calcium and vitamin D, diet and exercise return annually or prn  An After Visit Summary was printed and given to the patient.

## 2013-04-28 NOTE — Patient Instructions (Signed)

## 2013-05-01 NOTE — Progress Notes (Signed)
Encounter reviewed by Dr. Corlis Angelica Silva.  

## 2013-06-26 ENCOUNTER — Other Ambulatory Visit: Payer: Self-pay

## 2013-06-27 ENCOUNTER — Other Ambulatory Visit: Payer: Self-pay | Admitting: Family Medicine

## 2013-06-27 NOTE — Telephone Encounter (Signed)
Med filled.  

## 2013-07-01 ENCOUNTER — Encounter: Payer: Self-pay | Admitting: Nurse Practitioner

## 2013-12-10 ENCOUNTER — Telehealth: Payer: Self-pay | Admitting: Nurse Practitioner

## 2013-12-10 ENCOUNTER — Ambulatory Visit
Admission: RE | Admit: 2013-12-10 | Discharge: 2013-12-10 | Disposition: A | Discharge status: Home / Self Care | Payer: BC Managed Care – PPO | Source: Ambulatory Visit | Attending: Nurse Practitioner | Admitting: Nurse Practitioner

## 2013-12-10 ENCOUNTER — Telehealth: Payer: Self-pay | Admitting: Family Medicine

## 2013-12-10 ENCOUNTER — Ambulatory Visit (INDEPENDENT_AMBULATORY_CARE_PROVIDER_SITE_OTHER): Payer: BC Managed Care – PPO | Admitting: Nurse Practitioner

## 2013-12-10 VITALS — BP 147/85 | HR 78 | Temp 98.1°F | Wt 171.0 lb

## 2013-12-10 DIAGNOSIS — R112 Nausea with vomiting, unspecified: Secondary | ICD-10-CM

## 2013-12-10 DIAGNOSIS — R42 Dizziness and giddiness: Secondary | ICD-10-CM

## 2013-12-10 DIAGNOSIS — H6591 Unspecified nonsuppurative otitis media, right ear: Secondary | ICD-10-CM

## 2013-12-10 DIAGNOSIS — H6590 Unspecified nonsuppurative otitis media, unspecified ear: Secondary | ICD-10-CM

## 2013-12-10 LAB — CBC WITH DIFFERENTIAL/PLATELET
Basophils Absolute: 0 10*3/uL (ref 0.0–0.1)
Basophils Relative: 0.4 % (ref 0.0–3.0)
EOS PCT: 0.4 % (ref 0.0–5.0)
Eosinophils Absolute: 0 10*3/uL (ref 0.0–0.7)
HEMATOCRIT: 42.9 % (ref 36.0–46.0)
Hemoglobin: 14.6 g/dL (ref 12.0–15.0)
LYMPHS ABS: 1.3 10*3/uL (ref 0.7–4.0)
LYMPHS PCT: 15.9 % (ref 12.0–46.0)
MCHC: 33.9 g/dL (ref 30.0–36.0)
MCV: 102.6 fl — ABNORMAL HIGH (ref 78.0–100.0)
MONOS PCT: 3.8 % (ref 3.0–12.0)
Monocytes Absolute: 0.3 10*3/uL (ref 0.1–1.0)
Neutro Abs: 6.4 10*3/uL (ref 1.4–7.7)
Neutrophils Relative %: 79.5 % — ABNORMAL HIGH (ref 43.0–77.0)
PLATELETS: 207 10*3/uL (ref 150.0–400.0)
RBC: 4.18 Mil/uL (ref 3.87–5.11)
RDW: 13.3 % (ref 11.5–14.6)
WBC: 8.1 10*3/uL (ref 4.5–10.5)

## 2013-12-10 LAB — COMPREHENSIVE METABOLIC PANEL
ALBUMIN: 4.5 g/dL (ref 3.5–5.2)
ALT: 28 U/L (ref 0–35)
AST: 23 U/L (ref 0–37)
Alkaline Phosphatase: 78 U/L (ref 39–117)
BILIRUBIN TOTAL: 0.6 mg/dL (ref 0.3–1.2)
BUN: 12 mg/dL (ref 6–23)
CALCIUM: 10 mg/dL (ref 8.4–10.5)
CHLORIDE: 104 meq/L (ref 96–112)
CO2: 28 meq/L (ref 19–32)
Creatinine, Ser: 0.8 mg/dL (ref 0.4–1.2)
GFR: 78.83 mL/min (ref >60.00)
GLUCOSE: 117 mg/dL — AB (ref 70–99)
POTASSIUM: 5 meq/L (ref 3.5–5.1)
SODIUM: 142 meq/L (ref 135–145)
TOTAL PROTEIN: 7.9 g/dL (ref 6.0–8.3)

## 2013-12-10 MED ORDER — ONDANSETRON 8 MG PO TBDP: 8.0000 mg | ORAL_TABLET | Freq: Three times a day (TID) | ORAL | Status: DC | PRN

## 2013-12-10 MED ORDER — FLUTICASONE PROPIONATE 50 MCG/ACT NA SUSP: 1.0000 | Freq: Two times a day (BID) | NASAL | Status: DC

## 2013-12-10 MED ORDER — PROMETHAZINE HCL 25 MG/ML IJ SOLN
12.5000 mg | Freq: Once | INTRAMUSCULAR | Status: AC
  Administered 2013-12-10: 12.5 mg via INTRAMUSCULAR

## 2013-12-10 NOTE — Telephone Encounter (Signed)
Patient Information:  Caller Name: Kennyth Lose  Phone: [phone]  Patient: Elaine Lambert, Elaine Lambert  Gender: Female  DOB: Dec 26, 1950  Age: 63 Years  PCP: Midge Minium.  Office Follow Up:  Does the office need to follow up with this patient?: No  Instructions For The Office: N/A   Symptoms  Reason For Call & Symptoms: Not feeling well yesterday and today. Dizzy and nauseous since 0600 this morning-12/10/13. She has increased ear congestion >R ear. Hearing popping noise with swallowing. Ear sensitive and cotton swab used this morning. Afebrile. No other sx.   Reviewed Health History In EMR: Yes  Reviewed Medications In EMR: Yes  Reviewed Allergies In EMR: Yes  Reviewed Surgeries / Procedures: Yes  Date of Onset of Symptoms: 12/09/2013  Guideline(s) Used:  Dizziness  Disposition Per Guideline:   Go to Office Now  Reason For Disposition Reached:   Lightheadedness (dizziness) present now, after 2 hours of rest and fluids  Advice Given:  Drink Fluids:  Drink several glasses of fruit juice, other clear fluids, or water. This will improve hydration and blood glucose. If you have a fever or have had heat exposure, make sure the fluids are cold.  Rest for 1-2 Hours:  Lie down with feet elevated for 1 hour. This will improve blood flow and increase blood flow to the brain.  Some Causes of Temporary Dizziness:  Poor Fluid Intake - Not drinking enough fluids and being a little dehydrated is a common cause of temporary dizziness. This is always worse during hot weather.  Standing Up Suddenly - Standing up suddenly (especially getting out of bed) or prolonged standing in one place are common causes of temporary dizziness. Not drinking enough fluids always makes it worse. Certain medications can cause or increase this type of dizziness (e.g., blood pressure medications).  Stand Up Slowly:  In the mornings, sit up for a few minutes before you stand up. That will help your blood flow make the  adjustment.  If you have to stand up for long periods of time, contract and relax your leg muscles to help pump the blood back to the heart.  Sit down or lie down if you feel dizzy.  Call Back If:  Still feel dizzy after 2 hours of rest and fluids  Passes out (faints)  You become worse.  Patient Will Follow Care Advice:  YES  Appointment Scheduled:  12/10/2013 11:00:00 Appointment Scheduled Provider:  NP Kathlen Mody

## 2013-12-10 NOTE — Progress Notes (Signed)
Subjective:     Elaine Lambert is a 63 y.o. female who presents for evaluation of dizziness-reports feeling like room is spinning. The symptoms started 1 month ago and have been worse the last 2 days. The attacks occur daily in the morning on awakening. Positions that worsen symptoms: position changes & posterior extension of head. She has been taking 3T ibuprophen with relief of dizziness. Ibuprophen did not work yesterday or today. Associated ear symptoms: aural pressure tinnitus. Associated CNS symptoms: none and blurred vision, posterior neck pain, nausea started yesterday.. Recent infections: none. Head trauma: denied. Drug ingestion: none and She was taking HCTZ 12.5 mg qd. she stopped 1 mo ago when symptoms started because BP was 110. Bp normalized to 120 after stopped med. She took 1 dose this am due to elevated BP. Noise exposure: no occupational exposure.  The following portions of the patient's history were reviewed and updated as appropriate: allergies, current medications, past medical history, past social history, past surgical history and problem list.  Review of Systems Constitutional: negative for chills, fatigue and fevers Eyes: positive for blurred vision when dizzy, negative for irritation and redness Ears, nose, mouth, throat, and face: negative for nasal congestion, sore throat and positive for tinnitus Respiratory: negative for cough Cardiovascular: negative for chest pain, chest pressure/discomfort, irregular heart beat, lower extremity edema and near-syncope Gastrointestinal: positive for nausea and vomiting, negative for abdominal pain, constipation and diarrhea Integument/breast: negative for rash Musculoskeletal:negative for arthralgias and myalgias Neurological: positive for dizziness and gait problems, negative for coordination problems, headaches, tremors and weakness    Objective:    BP 147/85  Pulse 78  Temp(Src) 98.1 F (36.7 C) (Oral)  Wt 171 lb (77.565  kg)  SpO2 99% General appearance: alert, cooperative, appears stated age, mild distress and dizzy, nauseous, vomiting Head: Normocephalic, without obvious abnormality, atraumatic Eyes: negative findings: lids and lashes normal, conjunctivae and sclerae normal, pupils equal, round, reactive to light and accomodation and visual fields full to confrontation Ears: R TM effusion, clear fluid, bones visible. L TM Nml Throat: lips, mucosa, and tongue normal; teeth and gums normal Neck: no adenopathy, no carotid bruit, supple, symmetrical, trachea midline and thyroid not enlarged, symmetric, no tenderness/mass/nodules Lungs: clear to auscultation bilaterally Heart: regular rate and rhythm, S1, S2 normal, no murmur, click, rub or gallop Extremities: extremities normal, atraumatic, no cyanosis or edema Pulses: 2+ and symmetric Lymph nodes: Cervical, supraclavicular, and axillary nodes normal. Neurologic:Cranial nerve exam grossly nml, No nystagmus on head-thrust, head shake, or Dix-Hallpike. Nausea worse with position changes.   Unable to do romberg-looses balance. Gait unsteady.  Neg kernig & brudzinski. Assessment:  Dizzy w/ N&V DD: CNS vertigo, peripheral vertigo, arrythmia, viral illness Neck pain R otitiis media with effusion   Plan:  MRI brain today to r/o CNS causes for vertigo flonase for effusion zofran for nausea Phenergan 12.5 mg IM administered in ofc. Using 25 ga 1 " needle at R VG.

## 2013-12-10 NOTE — Telephone Encounter (Signed)
MRI brain has no acute findings that explain dizziness & nausea. She has scattered densities that could be MS, microvascular infarcts, vasculitis. LM on cell to discuss w/pt.

## 2013-12-10 NOTE — Progress Notes (Signed)
Pre-visit discussion using our clinic review tool. No additional management support is needed unless otherwise documented below in the visit note.  

## 2013-12-10 NOTE — Patient Instructions (Addendum)
I am not certain what is causing your dizziness. Please get MRI completed. This office will call you with results and further follow up.  VERTIGO Vertigo means you feel like you or your surroundings are moving when they are not. Vertigo can be dangerous if it occurs when you are at work, driving, or performing difficult activities.  CAUSES  Vertigo occurs when there is a conflict of signals sent to your brain from the visual and sensory systems in your body. There are many different causes of vertigo, including:  Infections, especially in the inner ear.  A bad reaction to a drug or misuse of alcohol and medicines.  Withdrawal from drugs or alcohol.  Rapidly changing positions, such as lying down or rolling over in bed.  A migraine headache.  Decreased blood flow to the brain.  Increased pressure in the brain from a head injury, infection, tumor, or bleeding. SYMPTOMS  You may feel as though the world is spinning around or you are falling to the ground. Because your balance is upset, vertigo can cause nausea and vomiting. You may have involuntary eye movements (nystagmus). DIAGNOSIS  Vertigo is usually diagnosed by physical exam. If the cause of your vertigo is unknown, your caregiver may perform imaging tests, such as an MRI scan (magnetic resonance imaging). TREATMENT  Most cases of vertigo resolve on their own, without treatment. Depending on the cause, your caregiver may prescribe certain medicines. If your vertigo is related to body position issues, your caregiver may recommend movements or procedures to correct the problem. In rare cases, if your vertigo is caused by certain inner ear problems, you may need surgery. HOME CARE INSTRUCTIONS   Follow your caregiver's instructions.  Avoid driving.  Avoid operating heavy machinery.  Avoid performing any tasks that would be dangerous to you or others during a vertigo episode.  Tell your caregiver if you notice that certain  medicines seem to be causing your vertigo. Some of the medicines used to treat vertigo episodes can actually make them worse in some people. SEEK IMMEDIATE MEDICAL CARE IF:   Your medicines do not relieve your vertigo or are making it worse.  You develop problems with talking, walking, weakness, or using your arms, hands, or legs.  You develop severe headaches.  Your nausea or vomiting continues or gets worse.  You develop visual changes.  A family member notices behavioral changes.  Your condition gets worse. MAKE SURE YOU:  Understand these instructions.  Will watch your condition.  Will get help right away if you are not doing well or get worse. Document Released: 05/17/2005 Document Revised: 10/30/2011 Document Reviewed: 02/23/2011 [facility] Patient Information 2014 [place].

## 2013-12-15 ENCOUNTER — Encounter: Payer: Self-pay | Admitting: Family Medicine

## 2013-12-15 ENCOUNTER — Ambulatory Visit (INDEPENDENT_AMBULATORY_CARE_PROVIDER_SITE_OTHER): Payer: BC Managed Care – PPO | Admitting: Family Medicine

## 2013-12-15 VITALS — BP 130/80 | HR 84 | Temp 98.4°F | Resp 16 | Wt 170.1 lb

## 2013-12-15 DIAGNOSIS — R930 Abnormal findings on diagnostic imaging of skull and head, not elsewhere classified: Secondary | ICD-10-CM

## 2013-12-15 DIAGNOSIS — R9089 Other abnormal findings on diagnostic imaging of central nervous system: Secondary | ICD-10-CM

## 2013-12-15 DIAGNOSIS — R42 Dizziness and giddiness: Secondary | ICD-10-CM

## 2013-12-15 MED ORDER — MECLIZINE HCL 50 MG PO TABS: 50.0000 mg | ORAL_TABLET | Freq: Three times a day (TID) | ORAL | Status: DC | PRN

## 2013-12-15 NOTE — Assessment & Plan Note (Signed)
New.  Reviewed MRI w/ pt.  She is currently asymptomatic w/ a normal neuro exam.  Pt not interested in pursuing workup but husband is.  Told them I could try and curbside neuro for their opinion on whether f/u was needed.  Pt appreciative of this.

## 2013-12-15 NOTE — Assessment & Plan Note (Signed)
New to provider.  Suspect this was due directly to pt's ear effusion and has subsequently resolved.  Pt given Meclizine to use prn.  Reviewed MRI results w/ pt.  Will follow.

## 2013-12-15 NOTE — Progress Notes (Signed)
Pre visit review using our clinic review tool, if applicable. No additional management support is needed unless otherwise documented below in the visit note. 

## 2013-12-15 NOTE — Patient Instructions (Signed)
Follow up as needed I'll run this by one of the neurologists and let you know what they say If you again develop vertigo- start the meclizine Drink plenty of fluids REST! Start Claritin or Zyrtec for the allergy component Call with any questions or concerns  Hang in there!!!

## 2013-12-15 NOTE — Progress Notes (Signed)
   Subjective:    Patient ID: Elaine Lambert, female    DOB: April 09, 1951, 63 y.o.   MRN: [id number]  HPI Vertigo- pt was seen 1 week ago by NP for vertigo, nausea.  During in-office testing vomited due to severity of sxs.  Today is feeling well.  Able to turn head today w/o difficulty but head still feeling full.  Using nasal steroid spray.  Not currently taking Claritin or Zyrtec.  Abnormal MRI- per report:  'Scattered periventricular and subcortical T2 hyperintensities are greater than expected for age. The finding is nonspecific but can be seen in the setting of chronic microvascular ischemia, a demyelinating process such as multiple sclerosis, vasculitis, complicated migraine headaches, or as the sequelae of a prior infectious or inflammatory process.'  Pt is asymptomatic but not sure if she needs to work this up.    Review of Systems For ROS see HPI     Objective:   Physical Exam  Vitals reviewed. Constitutional: She appears well-developed and well-nourished. No distress.  HENT:  Head: Normocephalic and atraumatic.  Right Ear: Tympanic membrane normal.  Left Ear: Tympanic membrane normal.  Nose: Mucosal edema and rhinorrhea present. Right sinus exhibits no maxillary sinus tenderness and no frontal sinus tenderness. Left sinus exhibits no maxillary sinus tenderness and no frontal sinus tenderness.  Mouth/Throat: Mucous membranes are normal. Posterior oropharyngeal erythema (w/ PND) present.  Eyes: Conjunctivae and EOM are normal. Pupils are equal, round, and reactive to light.  Neck: Normal range of motion. Neck supple.  Cardiovascular: Normal rate, regular rhythm and normal heart sounds.   Pulmonary/Chest: Effort normal and breath sounds normal. No respiratory distress. She has no wheezes. She has no rales.  Lymphadenopathy:    She has no cervical adenopathy.          Assessment & Plan:

## 2013-12-19 ENCOUNTER — Encounter: Payer: Self-pay | Admitting: Family Medicine

## 2014-05-01 ENCOUNTER — Ambulatory Visit: Payer: BC Managed Care – PPO | Admitting: Nurse Practitioner

## 2014-05-08 ENCOUNTER — Encounter: Payer: Self-pay | Admitting: Nurse Practitioner

## 2014-05-08 ENCOUNTER — Ambulatory Visit (INDEPENDENT_AMBULATORY_CARE_PROVIDER_SITE_OTHER): Payer: Medicare Other | Admitting: Nurse Practitioner

## 2014-05-08 VITALS — BP 120/78 | HR 78 | Ht 66.0 in | Wt 153.0 lb

## 2014-05-08 DIAGNOSIS — Z01419 Encounter for gynecological examination (general) (routine) without abnormal findings: Secondary | ICD-10-CM

## 2014-05-08 DIAGNOSIS — Z0000 Encounter for general adult medical examination without abnormal findings: Secondary | ICD-10-CM

## 2014-05-08 LAB — POCT URINALYSIS DIPSTICK
BILIRUBIN UA: NEGATIVE
Glucose, UA: NEGATIVE
KETONES UA: NEGATIVE
Leukocytes, UA: NEGATIVE
Nitrite, UA: NEGATIVE
PH UA: 6
Protein, UA: NEGATIVE
RBC UA: NEGATIVE
Urobilinogen, UA: NEGATIVE

## 2014-05-08 LAB — HEMOGLOBIN, FINGERSTICK: Hemoglobin, fingerstick: 13.8 g/dL (ref 12.0–16.0)

## 2014-05-08 NOTE — Patient Instructions (Signed)

## 2014-05-08 NOTE — Progress Notes (Signed)
Patient ID: Elaine Lambert, female   DOB: 22-Apr-1951, 63 y.o.   MRN: [id number] 63 y.o. G67P1001 Married Caucasian Fe here for annual exam.  No vaso symptoms and no vaginal dryness.  Since wt loss of 20 lbs. Has stopped HTN med's secondary to not feeling well.  She then started checking BP's and was down  Checking BP off and on at home.  Patient's last menstrual period was 08/21/1997.          Sexually active: yes  The current method of family planning is post menopausal status.  Exercising: yes Home exercise routine includes walking and treadmill, light weights. Smoker: no   Health Maintenance:  Pap: 04/26/12, WNL HR HPV neg  MMG: 05/22/12, BI-Rads 1: negative  Colonoscopy: 10/2012, [facility], 3 polyps, repeat in 5 years  BMD: 05/22/12, 0.3 Spine/-0.7 Left TDaP: 04/21/11  Shingles: 07/29/12 Pneumovax: 2014 Labs:  HB:  13.8  Urine:  Negative    reports that she quit smoking about 25 years ago. She has never used smokeless tobacco. She reports that she drinks about .5 - 1 ounces of alcohol per week. She reports that she does not use illicit drugs.  Past Medical History  Diagnosis Date  . Hypertension 06/2012    Past Surgical History  Procedure Laterality Date  . Incision and drainage breast abscess Left 11/15/2012    Dr. Rolly Salter    Current Outpatient Prescriptions  Medication Sig Dispense Refill  . fluticasone (FLONASE) 50 MCG/ACT nasal spray Place 1 spray into both nostrils 2 (two) times daily.  16 g  6   No current facility-administered medications for this visit.    Family History  Problem Relation Age of Onset  . Cancer Mother     SINUS, retinal  . Parkinson's disease Mother   . Lung cancer Father   . Liver cancer Father   . Testicular cancer Brother   . Prostate cancer Brother     ROS:  Pertinent items are noted in HPI.  Otherwise, a comprehensive ROS was negative.  Exam:   BP 120/78  Pulse 78  Ht 5\' 6"  (1.676 m)  Wt 153 lb (69.4 kg)  BMI 24.71 kg/m2  LMP  08/21/1997 Height: 5\' 6"  (167.6 cm)  Ht Readings from Last 3 Encounters:  05/08/14 5\' 6"  (1.676 m)  04/28/13 5' 6.5" (1.689 m)  01/24/13 5' 5.75" (1.67 m)    General appearance: alert, cooperative and appears stated age Head: Normocephalic, without obvious abnormality, atraumatic Neck: no adenopathy, supple, symmetrical, trachea midline and thyroid normal to inspection and palpation Lungs: clear to auscultation bilaterally Breasts: normal appearance, no masses or tenderness Heart: regular rate and rhythm Abdomen: soft, non-tender; no masses,  no organomegaly Extremities: extremities normal, atraumatic, no cyanosis or edema Skin: Skin color, texture, turgor normal. No rashes or lesions Lymph nodes: Cervical, supraclavicular, and axillary nodes normal. No abnormal inguinal nodes palpated Neurologic: Grossly normal   Pelvic: External genitalia:  no lesions              Urethra:  normal appearing urethra with no masses, tenderness or lesions              Bartholin's and Skene's: normal                 Vagina: normal appearing vagina with normal color and discharge, no lesions              Cervix: anteverted  Pap taken: No. Bimanual Exam:  Uterus:  normal size, contour, position, consistency, mobility, non-tender              Adnexa: no mass, fullness, tenderness               Rectovaginal: Confirms               Anus:  normal sphincter tone, no lesions  A:  Well Woman with normal exam  Menopausal - no HRT  History of HTN - now off med's secondary to weight loss  P:   Reviewed health and wellness pertinent to exam  Pap smear not taken today  Mammogram is due now an will schedule  Counseled on breast self exam, mammography screening, adequate intake of calcium and vitamin D, diet and exercise, Kegel's exercises return annually or prn  An After Visit Summary was printed and given to the patient.

## 2014-05-10 NOTE — Progress Notes (Signed)
Encounter reviewed by Dr. Mckaela Howley Silva.  

## 2014-06-22 ENCOUNTER — Encounter: Payer: Self-pay | Admitting: Nurse Practitioner

## 2014-11-16 DIAGNOSIS — H109 Unspecified conjunctivitis: Secondary | ICD-10-CM | POA: Diagnosis not present

## 2014-11-16 DIAGNOSIS — J329 Chronic sinusitis, unspecified: Secondary | ICD-10-CM | POA: Diagnosis not present

## 2015-02-02 ENCOUNTER — Encounter: Payer: Self-pay | Admitting: Family Medicine

## 2015-02-02 DIAGNOSIS — Z1231 Encounter for screening mammogram for malignant neoplasm of breast: Secondary | ICD-10-CM

## 2015-02-15 ENCOUNTER — Other Ambulatory Visit: Payer: Self-pay

## 2015-04-14 ENCOUNTER — Ambulatory Visit
Admission: RE | Admit: 2015-04-14 | Discharge: 2015-04-14 | Disposition: A | Discharge status: Home / Self Care | Payer: Self-pay | Source: Ambulatory Visit | Attending: Family Medicine | Admitting: Family Medicine

## 2015-04-14 DIAGNOSIS — Z1231 Encounter for screening mammogram for malignant neoplasm of breast: Secondary | ICD-10-CM

## 2015-05-14 ENCOUNTER — Ambulatory Visit (INDEPENDENT_AMBULATORY_CARE_PROVIDER_SITE_OTHER): Payer: Medicare Other | Admitting: Nurse Practitioner

## 2015-05-14 ENCOUNTER — Encounter: Payer: Self-pay | Admitting: Nurse Practitioner

## 2015-05-14 VITALS — BP 126/82 | HR 64 | Ht 66.25 in | Wt 166.0 lb

## 2015-05-14 DIAGNOSIS — E559 Vitamin D deficiency, unspecified: Secondary | ICD-10-CM

## 2015-05-14 DIAGNOSIS — Z01419 Encounter for gynecological examination (general) (routine) without abnormal findings: Secondary | ICD-10-CM

## 2015-05-14 DIAGNOSIS — E2839 Other primary ovarian failure: Secondary | ICD-10-CM

## 2015-05-14 DIAGNOSIS — H8110 Benign paroxysmal vertigo, unspecified ear: Secondary | ICD-10-CM | POA: Diagnosis not present

## 2015-05-14 DIAGNOSIS — Z0000 Encounter for general adult medical examination without abnormal findings: Secondary | ICD-10-CM

## 2015-05-14 DIAGNOSIS — E7800 Pure hypercholesterolemia, unspecified: Secondary | ICD-10-CM

## 2015-05-14 LAB — LIPID PANEL
CHOL/HDL RATIO: 2.9 ratio (ref <=5.0)
Cholesterol: 228 mg/dL — ABNORMAL HIGH (ref 125–200)
HDL: 78 mg/dL (ref >=46)
LDL CALC: 139 mg/dL — AB (ref <130)
Triglycerides: 53 mg/dL (ref <150)
VLDL: 11 mg/dL (ref <30)

## 2015-05-14 LAB — COMPREHENSIVE METABOLIC PANEL
ALK PHOS: 67 U/L (ref 33–130)
ALT: 13 U/L (ref 6–29)
AST: 17 U/L (ref 10–35)
Albumin: 4.4 g/dL (ref 3.6–5.1)
BUN: 12 mg/dL (ref 7–25)
CO2: 29 mmol/L (ref 20–31)
Calcium: 9.8 mg/dL (ref 8.6–10.4)
Chloride: 104 mmol/L (ref 98–110)
Creat: 0.76 mg/dL (ref 0.50–0.99)
GLUCOSE: 94 mg/dL (ref 65–99)
POTASSIUM: 5 mmol/L (ref 3.5–5.3)
Sodium: 142 mmol/L (ref 135–146)
Total Bilirubin: 0.6 mg/dL (ref 0.2–1.2)
Total Protein: 6.9 g/dL (ref 6.1–8.1)

## 2015-05-14 LAB — POCT URINALYSIS DIPSTICK
BILIRUBIN UA: NEGATIVE
Blood, UA: NEGATIVE
Glucose, UA: NEGATIVE
KETONES UA: NEGATIVE
Leukocytes, UA: NEGATIVE
Nitrite, UA: NEGATIVE
PROTEIN UA: NEGATIVE
Urobilinogen, UA: NEGATIVE
pH, UA: 6

## 2015-05-14 LAB — HEMOGLOBIN, FINGERSTICK: Hemoglobin, fingerstick: 13.8 g/dL (ref 12.0–16.0)

## 2015-05-14 LAB — TSH: TSH: 0.95 u[IU]/mL (ref 0.350–4.500)

## 2015-05-14 NOTE — Patient Instructions (Signed)

## 2015-05-14 NOTE — Progress Notes (Signed)
Patient ID: Elaine Lambert, female   DOB: Jan 11, 1951, 64 y.o.   MRN: [id number] 64 y.o. G59P1001 Married  Caucasian Fe here for annual exam.  Now only working 3 days a week at [employer].  She feels well.  Still off BP med's since her weight loss.  Patient's last menstrual period was 08/21/1997.          Sexually active: Yes.    The current method of family planning is post menopausal status.    Exercising: Yes.    Home exercise routine includes treadmill and walking 3 times per week. Smoker:  Former smoker, quit in [place] Maintenance: Pap: 04/26/12, WNL HR HPV neg  MMG: 04/14/15, BI-Rads 1: Negative  Colonoscopy: 10/2012, [facility], 3 polyps, repeat in 5 years  BMD: 05/22/12, 0.3 Spine/-0.7 Left Hip TDaP: 04/21/11  Shingles Vaccine: 07/29/12 Pneumovax: 2014 Labs: HB:  13.8   Urine:  Negative    reports that she quit smoking about 26 years ago. She has never used smokeless tobacco. She reports that she drinks about 0.5 - 1.0 oz of alcohol per week. She reports that she does not use illicit drugs.  Past Medical History  Diagnosis Date  . Hypertension 06/2012    Past Surgical History  Procedure Laterality Date  . Incision and drainage breast abscess Left 11/15/2012    Dr. Rolly Salter    Current Outpatient Prescriptions  Medication Sig Dispense Refill  . fluticasone (FLONASE) 50 MCG/ACT nasal spray Place 1 spray into both nostrils 2 (two) times daily. 16 g 6   No current facility-administered medications for this visit.    Family History  Problem Relation Age of Onset  . Cancer Mother     SINUS, retinal  . Parkinson's disease Mother   . Lung cancer Father   . Liver cancer Father   . Testicular cancer Brother   . Prostate cancer Brother     ROS:  Pertinent items are noted in HPI.  Otherwise, a comprehensive ROS was negative.  Exam:   BP 126/82 mmHg  Pulse 64  Ht 5' 6.25" (1.683 m)  Wt 166 lb (75.297 kg)  BMI 26.58 kg/m2  LMP 08/21/1997 Height: 5' 6.25"  (168.3 cm) Ht Readings from Last 3 Encounters:  05/14/15 5' 6.25" (1.683 m)  05/08/14 '5\' 6"'$  (1.676 m)  04/28/13 5' 6.5" (1.689 m)    General appearance: alert, cooperative and appears stated age Head: Normocephalic, without obvious abnormality, atraumatic Neck: no adenopathy, supple, symmetrical, trachea midline and thyroid normal to inspection and palpation Lungs: clear to auscultation bilaterally Breasts: normal appearance, no masses or tenderness Heart: regular rate and rhythm Abdomen: soft, non-tender; no masses,  no organomegaly Extremities: extremities normal, atraumatic, no cyanosis or edema Skin: Skin color, texture, turgor normal. No rashes or lesions Lymph nodes: Cervical, supraclavicular, and axillary nodes normal. No abnormal inguinal nodes palpated Neurologic: Grossly normal   Pelvic: External genitalia:  no lesions              Urethra:  normal appearing urethra with no masses, tenderness or lesions              Bartholin's and Skene's: normal                 Vagina: normal appearing vagina with normal color and discharge, no lesions              Cervix: anteverted              Pap taken: Yes.  Bimanual Exam:  Uterus:  normal size, contour, position, consistency, mobility, non-tender              Adnexa: no mass, fullness, tenderness               Rectovaginal: Confirms               Anus:  normal sphincter tone, no lesions  Chaperone present: yes  A:  Well Woman with normal exam  Menopausal - no HRT History of HTN - now off med's secondary to weight loss in 2015    P:   Reviewed health and wellness pertinent to exam  Pap smear as above  Mammogram is due 03/2016  Order placed for BMD for next year as well  Counseled on breast self exam, mammography screening, adequate intake of calcium and vitamin D, diet and exercise, Kegel's exercises return annually or prn  An After Visit Summary was printed and given to the patient.

## 2015-05-15 LAB — VITAMIN D 25 HYDROXY (VIT D DEFICIENCY, FRACTURES): VIT D 25 HYDROXY: 32 ng/mL (ref 30–100)

## 2015-05-16 NOTE — Progress Notes (Signed)
Encounter reviewed by Dr. Brook Amundson C. Silva.  

## 2015-05-18 LAB — IPS PAP SMEAR ONLY

## 2015-07-23 DIAGNOSIS — H2513 Age-related nuclear cataract, bilateral: Secondary | ICD-10-CM | POA: Diagnosis not present

## 2015-07-23 DIAGNOSIS — H40053 Ocular hypertension, bilateral: Secondary | ICD-10-CM | POA: Diagnosis not present

## 2015-11-26 DIAGNOSIS — H40053 Ocular hypertension, bilateral: Secondary | ICD-10-CM | POA: Diagnosis not present

## 2016-05-09 ENCOUNTER — Encounter: Payer: Self-pay | Admitting: Nurse Practitioner

## 2016-05-09 NOTE — Progress Notes (Signed)
Patient ID: Elaine Lambert, female   DOB: [birth year], 65 y.o.   MRN: [id number]  65 y.o. [id number] Married  Caucasian Fe here for annual exam.  No new health problems.  Still not back on BP medication.  Patient's last menstrual period was 08/21/1997.          Sexually active: Yes.    The current method of family planning is post menopausal status.    Exercising: Yes.    Home exercise routine includes treadmill and walking 3 times per week.. Smoker:  Former smoker, quit 1990  Health Maintenance: Pap: 05/14/15, Negative MMG: 04/14/15, BI-Rads 1: Negative (not scheduled for 2017) Colonoscopy: 10/2012, [facility], 3 polyps, repeat in 5 years  BMD: 05/22/12, 0.3 Spine  / -0.7 Left Femur Neck TDaP: 04/21/11  Shingles Vaccine: 07/29/12 Pneumovax: 2014 Hep C: done today Labs: PCP will take care of labs at CPE in January   reports that she quit smoking about 27 years ago. She has a 10.00 pack-year smoking history. She has never used smokeless tobacco. She reports that she drinks about 0.5 - 1.0 oz of alcohol per week . She reports that she does not use drugs.  Past Medical History:  Diagnosis Date  . Hypertension 06/2012   off Meds 2015 since wt loss    Past Surgical History:  Procedure Laterality Date  . INCISION AND DRAINAGE BREAST ABSCESS Left 11/15/2012   Dr. Rolly Salter    Current Outpatient Prescriptions  Medication Sig Dispense Refill  . fluticasone (FLONASE) 50 MCG/ACT nasal spray Place 1 spray into both nostrils 2 (two) times daily. 16 g 6   No current facility-administered medications for this visit.     Family History  Problem Relation Age of Onset  . Cancer Mother     SINUS, retinal  . Parkinson's disease Mother   . Lung cancer Father   . Liver cancer Father   . Testicular cancer Brother   . Prostate cancer Brother   . Atrial fibrillation Brother     ROS:  Pertinent items are noted in HPI.  Otherwise, a comprehensive ROS was negative.  Exam:   LMP 08/21/1997     Ht Readings from Last 3 Encounters:  05/14/15 5' 6.25" (1.683 m)  05/08/14 '5\' 6"'$  (1.676 m)  04/28/13 5' 6.5" (1.689 m)    General appearance: alert, cooperative and appears stated age Head: Normocephalic, without obvious abnormality, atraumatic Neck: no adenopathy, supple, symmetrical, trachea midline and thyroid normal to inspection and palpation Lungs: clear to auscultation bilaterally Breasts: normal appearance, no masses or tenderness Heart: regular rate and rhythm Abdomen: soft, non-tender; no masses,  no organomegaly Extremities: extremities normal, atraumatic, no cyanosis or edema Skin: Skin color, texture, turgor normal. No rashes or lesions Lymph nodes: Cervical, supraclavicular, and axillary nodes normal. No abnormal inguinal nodes palpated Neurologic: Grossly normal   Pelvic: External genitalia:  no lesions              Urethra:  normal appearing urethra with no masses, tenderness or lesions              Bartholin's and Skene's: normal                 Vagina: normal appearing vagina with normal color and discharge, no lesions              Cervix: anteverted              Pap taken: No. Bimanual Exam:  Uterus:  normal size, contour,  position, consistency, mobility, non-tender              Adnexa: no mass, fullness, tenderness               Rectovaginal: Confirms               Anus:  normal sphincter tone, no lesions  Chaperone present: yes  A:  Well Woman with normal exam  Menopausal - no HRT History of HTN - now off med's secondary to weight loss in 2015   P:   Reviewed health and wellness pertinent to exam  Pap smear as above  Mammogram is due and will get BMD at same time  Follow with lab  Counseled on breast self exam, mammography screening, adequate intake of calcium and vitamin D, diet and exercise, Kegel's exercises return annually or prn  An After Visit Summary was printed and given to the patient.

## 2016-05-15 ENCOUNTER — Encounter: Payer: Self-pay | Admitting: Nurse Practitioner

## 2016-05-15 ENCOUNTER — Ambulatory Visit (INDEPENDENT_AMBULATORY_CARE_PROVIDER_SITE_OTHER): Payer: Medicare Other | Admitting: Nurse Practitioner

## 2016-05-15 VITALS — BP 126/78 | HR 72 | Ht 65.75 in | Wt 168.0 lb

## 2016-05-15 DIAGNOSIS — Z01419 Encounter for gynecological examination (general) (routine) without abnormal findings: Secondary | ICD-10-CM | POA: Diagnosis not present

## 2016-05-15 DIAGNOSIS — E2839 Other primary ovarian failure: Secondary | ICD-10-CM

## 2016-05-15 DIAGNOSIS — Z1159 Encounter for screening for other viral diseases: Secondary | ICD-10-CM | POA: Diagnosis not present

## 2016-05-15 DIAGNOSIS — Z0000 Encounter for general adult medical examination without abnormal findings: Secondary | ICD-10-CM

## 2016-05-15 NOTE — Patient Instructions (Signed)

## 2016-05-16 LAB — HEPATITIS C ANTIBODY: HCV Ab: NEGATIVE

## 2016-05-18 NOTE — Progress Notes (Signed)
Encounter reviewed by Dr. Brook Amundson C. Silva.  

## 2016-09-15 ENCOUNTER — Encounter: Payer: Self-pay | Admitting: Family Medicine

## 2016-09-15 ENCOUNTER — Ambulatory Visit (INDEPENDENT_AMBULATORY_CARE_PROVIDER_SITE_OTHER): Payer: Medicare Other | Admitting: Family Medicine

## 2016-09-15 VITALS — BP 122/80 | HR 78 | Temp 98.0°F | Resp 16 | Ht 66.0 in | Wt 168.2 lb

## 2016-09-15 DIAGNOSIS — Z1231 Encounter for screening mammogram for malignant neoplasm of breast: Secondary | ICD-10-CM

## 2016-09-15 DIAGNOSIS — Z23 Encounter for immunization: Secondary | ICD-10-CM | POA: Diagnosis not present

## 2016-09-15 DIAGNOSIS — Z780 Asymptomatic menopausal state: Secondary | ICD-10-CM

## 2016-09-15 DIAGNOSIS — I10 Essential (primary) hypertension: Secondary | ICD-10-CM | POA: Diagnosis not present

## 2016-09-15 DIAGNOSIS — Z0000 Encounter for general adult medical examination without abnormal findings: Secondary | ICD-10-CM

## 2016-09-15 DIAGNOSIS — H6591 Unspecified nonsuppurative otitis media, right ear: Secondary | ICD-10-CM

## 2016-09-15 LAB — LIPID PANEL
Cholesterol: 197 mg/dL (ref 0–200)
HDL: 38.9 mg/dL — ABNORMAL LOW (ref >39.00)
LDL Cholesterol: 143 mg/dL — ABNORMAL HIGH (ref 0–99)
NONHDL: 157.69
TRIGLYCERIDES: 72 mg/dL (ref 0.0–149.0)
Total CHOL/HDL Ratio: 5
VLDL: 14.4 mg/dL (ref 0.0–40.0)

## 2016-09-15 LAB — BASIC METABOLIC PANEL WITH GFR
BUN: 15 mg/dL (ref 6–23)
CO2: 28 meq/L (ref 19–32)
Calcium: 9.7 mg/dL (ref 8.4–10.5)
Chloride: 104 meq/L (ref 96–112)
Creatinine, Ser: 0.69 mg/dL (ref 0.40–1.20)
GFR: 90.04 mL/min
Glucose, Bld: 71 mg/dL (ref 70–99)
Potassium: 5.1 meq/L (ref 3.5–5.1)
Sodium: 140 meq/L (ref 135–145)

## 2016-09-15 LAB — CBC WITH DIFFERENTIAL/PLATELET
Basophils Absolute: 0.1 K/uL (ref 0.0–0.1)
Basophils Relative: 1 % (ref 0.0–3.0)
Eosinophils Absolute: 0.1 K/uL (ref 0.0–0.7)
Eosinophils Relative: 1.7 % (ref 0.0–5.0)
HCT: 39.3 % (ref 36.0–46.0)
Hemoglobin: 13.5 g/dL (ref 12.0–15.0)
Lymphocytes Relative: 26.7 % (ref 12.0–46.0)
Lymphs Abs: 1.5 K/uL (ref 0.7–4.0)
MCHC: 34.4 g/dL (ref 30.0–36.0)
MCV: 101.4 fl — ABNORMAL HIGH (ref 78.0–100.0)
Monocytes Absolute: 0.5 K/uL (ref 0.1–1.0)
Monocytes Relative: 8.3 % (ref 3.0–12.0)
Neutro Abs: 3.5 K/uL (ref 1.4–7.7)
Neutrophils Relative %: 62.3 % (ref 43.0–77.0)
Platelets: 194 K/uL (ref 150.0–400.0)
RBC: 3.88 Mil/uL (ref 3.87–5.11)
RDW: 12.4 % (ref 11.5–15.5)
WBC: 5.6 K/uL (ref 4.0–10.5)

## 2016-09-15 LAB — HEPATIC FUNCTION PANEL
ALBUMIN: 4.3 g/dL (ref 3.5–5.2)
ALK PHOS: 56 U/L (ref 39–117)
ALT: 45 U/L — ABNORMAL HIGH (ref 0–35)
AST: 117 U/L — AB (ref 0–37)
BILIRUBIN DIRECT: 0.1 mg/dL (ref 0.0–0.3)
BILIRUBIN TOTAL: 0.5 mg/dL (ref 0.2–1.2)
Total Protein: 6.5 g/dL (ref 6.0–8.3)

## 2016-09-15 LAB — TSH: TSH: 0.62 u[IU]/mL (ref 0.35–4.50)

## 2016-09-15 MED ORDER — FLUTICASONE PROPIONATE 50 MCG/ACT NA SUSP: 1.0000 | Freq: Two times a day (BID) | NASAL | 6 refills | Status: DC

## 2016-09-15 NOTE — Assessment & Plan Note (Signed)
Pt's PE WNL w/ exception of being overweight.  Due for mammo and DEXA- orders enter.  Prevnar given today.  Written screening schedule updated and given to pt.  Check labs.  Anticipatory guidance provided.

## 2016-09-15 NOTE — Progress Notes (Signed)
Pre visit review using our clinic review tool, if applicable. No additional management support is needed unless otherwise documented below in the visit note. 

## 2016-09-15 NOTE — Patient Instructions (Signed)
Follow up in 1 year or as needed We'll notify you of your lab results and make any changes if needed Keep up the good work on healthy diet and regular exercise- you look great!!! You are up to date on colonoscopy until 2022- yay!!! We'll call you with your bone density and mammo appts Call with any questions or concerns Have a great weekend and [place] trip!!!

## 2016-09-15 NOTE — Progress Notes (Signed)
   Subjective:    Patient ID: Elaine Lambert, female    DOB: [birth year], 66 y.o.   MRN: [id number]  HPI Here today for CPE.  Risk Factors: HTN- chronic problem, currently controlled w/ diet and exercise Physical Activity: exercising regularly- joined a gym Fall Risk: low Depression: denies Hearing: normal to conversational tones and whispered voice at 6 ft ADL's: independent Cognitive: normal linear thought process Home Safety: safe at home Height, Weight, BMI, Visual Acuity: see vitals, vision corrected to 20/20 w/ contacts Counseling: UTD on colonoscopy (due 2022), due for mammo/DEXA.  Due for [employer] of Attorney/Living Will- forms given today Labs Ordered: See A&P Care Plan: See A&P    Review of Systems Patient reports no vision/ hearing changes, adenopathy,fever, weight change,  persistant/recurrent hoarseness , swallowing issues, chest pain, palpitations, edema, persistant/recurrent cough, hemoptysis, dyspnea (rest/exertional/paroxysmal nocturnal), gastrointestinal bleeding (melena, rectal bleeding), abdominal pain, significant heartburn, bowel changes, GU symptoms (dysuria, hematuria, incontinence), Gyn symptoms (abnormal  bleeding, pain),  syncope, focal weakness, memory loss, numbness & tingling, skin/hair, abnormal bruising or bleeding, anxiety, or depression.  + nail fungus     Objective:   Physical Exam General Appearance:    Alert, cooperative, no distress, appears stated age  Head:    Normocephalic, without obvious abnormality, atraumatic  Eyes:    PERRL, conjunctiva/corneas clear, EOM's intact, fundi    benign, both eyes  Ears:    Normal TM's and external ear canals, both ears  Nose:   Nares normal, septum midline, mucosa normal, no drainage    or sinus tenderness  Throat:   Lips, mucosa, and tongue normal; teeth and gums normal  Neck:   Supple, symmetrical, trachea midline, no adenopathy;    Thyroid: no enlargement/tenderness/nodules  Back:      Symmetric, no curvature, ROM normal, no CVA tenderness  Lungs:     Clear to auscultation bilaterally, respirations unlabored  Chest Wall:    No tenderness or deformity   Heart:    Regular rate and rhythm, S1 and S2 normal, no murmur, rub   or gallop  Breast Exam:    Deferred to GYN  Abdomen:     Soft, non-tender, bowel sounds active all four quadrants,    no masses, no organomegaly  Genitalia:    Deferred to GYN  Rectal:    Extremities:   Extremities normal, atraumatic, no cyanosis or edema  Pulses:   2+ and symmetric all extremities  Skin:   Skin color, texture, turgor normal, no rashes or lesions  Lymph nodes:   Cervical, supraclavicular, and axillary nodes normal  Neurologic:   CNII-XII intact, normal strength, sensation and reflexes    throughout          Assessment & Plan:

## 2016-09-15 NOTE — Assessment & Plan Note (Signed)
Pt has hx of this but has been off medication since losing considerable weight.  Stressed need for healthy diet and regular exercise and applauded her efforts at both.  Check labs to risk stratify.  Will follow.

## 2016-09-18 ENCOUNTER — Ambulatory Visit (HOSPITAL_BASED_OUTPATIENT_CLINIC_OR_DEPARTMENT_OTHER)
Admission: RE | Admit: 2016-09-18 | Discharge: 2016-09-18 | Disposition: A | Discharge status: Home / Self Care | Payer: Medicare Other | Source: Ambulatory Visit | Attending: Family Medicine | Admitting: Family Medicine

## 2016-09-18 ENCOUNTER — Other Ambulatory Visit: Payer: Self-pay | Admitting: Family Medicine

## 2016-09-18 DIAGNOSIS — Z1231 Encounter for screening mammogram for malignant neoplasm of breast: Secondary | ICD-10-CM | POA: Insufficient documentation

## 2016-09-18 DIAGNOSIS — Z780 Asymptomatic menopausal state: Secondary | ICD-10-CM | POA: Insufficient documentation

## 2016-09-18 DIAGNOSIS — R7989 Other specified abnormal findings of blood chemistry: Secondary | ICD-10-CM

## 2016-09-18 DIAGNOSIS — R945 Abnormal results of liver function studies: Principal | ICD-10-CM

## 2016-11-28 ENCOUNTER — Telehealth: Payer: Self-pay | Admitting: Family Medicine

## 2016-11-28 MED ORDER — MECLIZINE HCL 25 MG PO TABS: 25.0000 mg | ORAL_TABLET | Freq: Three times a day (TID) | ORAL | 0 refills | Status: DC | PRN

## 2016-11-28 NOTE — Telephone Encounter (Signed)
[place] for Meclizine '25mg'$  TID prn dizziness, #30, no refills

## 2016-11-28 NOTE — Telephone Encounter (Signed)
Please advise 

## 2016-11-28 NOTE — Telephone Encounter (Signed)
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 [facility] in [place]

## 2016-11-28 NOTE — Telephone Encounter (Signed)
Patient notified of PCP recommendations and is agreement and expresses an understanding.  

## 2017-01-09 DIAGNOSIS — H00025 Hordeolum internum left lower eyelid: Secondary | ICD-10-CM | POA: Diagnosis not present

## 2017-01-26 DIAGNOSIS — H2513 Age-related nuclear cataract, bilateral: Secondary | ICD-10-CM | POA: Diagnosis not present

## 2017-01-26 DIAGNOSIS — H0015 Chalazion left lower eyelid: Secondary | ICD-10-CM | POA: Diagnosis not present

## 2017-04-02 ENCOUNTER — Telehealth: Payer: Self-pay | Admitting: Family Medicine

## 2017-04-02 DIAGNOSIS — H40003 Preglaucoma, unspecified, bilateral: Secondary | ICD-10-CM | POA: Diagnosis not present

## 2017-04-02 DIAGNOSIS — H2512 Age-related nuclear cataract, left eye: Secondary | ICD-10-CM | POA: Diagnosis not present

## 2017-04-02 DIAGNOSIS — H2522 Age-related cataract, morgagnian type, left eye: Secondary | ICD-10-CM | POA: Diagnosis not present

## 2017-04-02 DIAGNOSIS — H2511 Age-related nuclear cataract, right eye: Secondary | ICD-10-CM | POA: Diagnosis not present

## 2017-04-02 DIAGNOSIS — Z1211 Encounter for screening for malignant neoplasm of colon: Secondary | ICD-10-CM

## 2017-04-02 NOTE — Telephone Encounter (Signed)
Per [facility] where pt previously had this done she is due for a repeat. Pt did have 3 polyps removed they were precancerous. Pt would like to proceed with colonoscopy with [facility] if this is necessary. Please advise?

## 2017-04-02 NOTE — Telephone Encounter (Signed)
Patient transferred to office via [facility] automated phone call.  She just received message she is due for her colonoscopy.  Needs referral entered.

## 2017-04-03 ENCOUNTER — Telehealth: Payer: Self-pay | Admitting: Gastroenterology

## 2017-04-03 NOTE — Telephone Encounter (Signed)
Ok for referral to [facility] for colon cancer screen

## 2017-04-03 NOTE — Telephone Encounter (Signed)
Colon/path reports received. Dr. Ardis Hughs is Doc of the day AM for 04/03/17. Records placed on his desk for review.

## 2017-04-03 NOTE — Telephone Encounter (Signed)
Pt informed and referral placed.

## 2017-04-04 NOTE — Telephone Encounter (Signed)
Dr.Jacobs' reviewed records and recommends patient have a 5 year repeat colonoscopy from 07/2012. Recall placed in system for 07/21/2017 and we will send a letter to remind patient to scheduled appointment. Left message for patient notifying her of this.

## 2017-04-05 DIAGNOSIS — Z9841 Cataract extraction status, right eye: Secondary | ICD-10-CM | POA: Diagnosis not present

## 2017-04-05 DIAGNOSIS — H2511 Age-related nuclear cataract, right eye: Secondary | ICD-10-CM | POA: Diagnosis not present

## 2017-04-19 DIAGNOSIS — Z9849 Cataract extraction status, unspecified eye: Secondary | ICD-10-CM | POA: Diagnosis not present

## 2017-04-19 DIAGNOSIS — H2512 Age-related nuclear cataract, left eye: Secondary | ICD-10-CM | POA: Diagnosis not present

## 2017-05-18 ENCOUNTER — Ambulatory Visit: Payer: Medicare Other | Admitting: Nurse Practitioner

## 2017-05-18 ENCOUNTER — Ambulatory Visit: Payer: Medicare Other | Admitting: Certified Nurse Midwife

## 2017-05-23 DIAGNOSIS — H02413 Mechanical ptosis of bilateral eyelids: Secondary | ICD-10-CM | POA: Diagnosis not present

## 2017-05-28 DIAGNOSIS — H02413 Mechanical ptosis of bilateral eyelids: Secondary | ICD-10-CM | POA: Diagnosis not present

## 2017-06-01 DIAGNOSIS — H00025 Hordeolum internum left lower eyelid: Secondary | ICD-10-CM | POA: Diagnosis not present

## 2017-06-06 ENCOUNTER — Encounter: Payer: Self-pay | Admitting: Gastroenterology

## 2017-06-12 ENCOUNTER — Other Ambulatory Visit: Payer: Self-pay | Admitting: Family Medicine

## 2017-06-12 DIAGNOSIS — H6591 Unspecified nonsuppurative otitis media, right ear: Secondary | ICD-10-CM

## 2017-06-13 ENCOUNTER — Encounter: Payer: Self-pay | Admitting: Gastroenterology

## 2017-06-19 ENCOUNTER — Other Ambulatory Visit: Payer: Self-pay | Admitting: General Practice

## 2017-06-19 DIAGNOSIS — H6591 Unspecified nonsuppurative otitis media, right ear: Secondary | ICD-10-CM

## 2017-06-19 NOTE — Telephone Encounter (Signed)
Please advise, pt would like her flonase and meclizine to be sent to mail order.

## 2017-06-20 MED ORDER — MECLIZINE HCL 25 MG PO TABS: 25.0000 mg | ORAL_TABLET | Freq: Three times a day (TID) | ORAL | 1 refills | Status: DC | PRN

## 2017-06-20 MED ORDER — FLUTICASONE PROPIONATE 50 MCG/ACT NA SUSP: 1.0000 | Freq: Two times a day (BID) | NASAL | 1 refills | Status: DC

## 2017-07-20 ENCOUNTER — Ambulatory Visit (AMBULATORY_SURGERY_CENTER): Payer: Self-pay

## 2017-07-20 ENCOUNTER — Other Ambulatory Visit: Payer: Self-pay

## 2017-07-20 VITALS — Ht 66.0 in | Wt 176.4 lb

## 2017-07-20 DIAGNOSIS — Z86010 Personal history of colonic polyps: Secondary | ICD-10-CM

## 2017-07-20 MED ORDER — NA SULFATE-K SULFATE-MG SULF 17.5-3.13-1.6 GM/177ML PO SOLN: 1.0000 | Freq: Once | ORAL | 0 refills | Status: AC

## 2017-07-20 NOTE — Progress Notes (Signed)
Denies allergies to eggs or soy products. Denies complication of anesthesia or sedation. Denies use of weight loss medication. Denies use of O2.   Emmi instructions declined.  

## 2017-07-25 ENCOUNTER — Encounter: Payer: Self-pay | Admitting: Gastroenterology

## 2017-07-27 DIAGNOSIS — H02834 Dermatochalasis of left upper eyelid: Secondary | ICD-10-CM | POA: Diagnosis not present

## 2017-07-27 DIAGNOSIS — H02413 Mechanical ptosis of bilateral eyelids: Secondary | ICD-10-CM | POA: Diagnosis not present

## 2017-07-27 DIAGNOSIS — H02831 Dermatochalasis of right upper eyelid: Secondary | ICD-10-CM | POA: Diagnosis not present

## 2017-08-03 ENCOUNTER — Ambulatory Visit (AMBULATORY_SURGERY_CENTER): Payer: Medicare Other | Admitting: Gastroenterology

## 2017-08-03 ENCOUNTER — Encounter: Payer: Self-pay | Admitting: Gastroenterology

## 2017-08-03 ENCOUNTER — Other Ambulatory Visit: Payer: Self-pay

## 2017-08-03 VITALS — BP 133/94 | HR 67 | Temp 97.0°F | Resp 13 | Ht 66.0 in | Wt 176.0 lb

## 2017-08-03 DIAGNOSIS — D123 Benign neoplasm of transverse colon: Secondary | ICD-10-CM | POA: Diagnosis not present

## 2017-08-03 DIAGNOSIS — Z86010 Personal history of colonic polyps: Secondary | ICD-10-CM | POA: Diagnosis present

## 2017-08-03 DIAGNOSIS — D124 Benign neoplasm of descending colon: Secondary | ICD-10-CM

## 2017-08-03 NOTE — Progress Notes (Signed)
Report to PACU, RN, vss, BBS= Clear.  

## 2017-08-03 NOTE — Progress Notes (Signed)
Called to room to assist during endoscopic procedure.  Patient ID and intended procedure confirmed with present staff. Received instructions for my participation in the procedure from the performing physician.  

## 2017-08-03 NOTE — Op Note (Signed)
[facility] Patient Name: Elaine Lambert Procedure Date: 08/03/2017 1:25 PM MRN: [id number] Endoscopist: Milus Banister , MD Age: 66 Referring MD:  Date of Birth: [birth year] Gender: Female Account #: [id number] Procedure:                Colonoscopy Indications:              High risk colon cancer surveillance: Personal                            history of colonic polyps; colonoscopy 2013 Dr.                            Ferdinand Lango found 5 subCM polyps, one was adenomatous Medicines:                Monitored Anesthesia Care Procedure:                Pre-Anesthesia Assessment:                           - Prior to the procedure, a History and Physical                            was performed, and patient medications and                            allergies were reviewed. The patient's tolerance of                            previous anesthesia was also reviewed. The risks                            and benefits of the procedure and the sedation                            options and risks were discussed with the patient.                            All questions were answered, and informed consent                            was obtained. Prior Anticoagulants: The patient has                            taken no previous anticoagulant or antiplatelet                            agents. ASA Grade Assessment: II - A patient with                            mild systemic disease. After reviewing the risks                            and benefits, the patient was deemed in  satisfactory condition to undergo the procedure.                           - Prior to the procedure, a History and Physical                            was performed, and patient medications and                            allergies were reviewed. The patient's tolerance of                            previous anesthesia was also reviewed. The risks                            and benefits of the  procedure and the sedation                            options and risks were discussed with the patient.                            All questions were answered, and informed consent                            was obtained. Prior Anticoagulants: The patient has                            taken no previous anticoagulant or antiplatelet                            agents. ASA Grade Assessment: II - A patient with                            mild systemic disease. After reviewing the risks                            and benefits, the patient was deemed in                            satisfactory condition to undergo the procedure.                           After obtaining informed consent, the colonoscope                            was passed under direct vision. Throughout the                            procedure, the patient's blood pressure, pulse, and                            oxygen saturations were monitored continuously. The  Colonoscope was introduced through the anus and                            advanced to the the cecum, identified by                            appendiceal orifice and ileocecal valve. The                            colonoscopy was performed without difficulty. The                            patient tolerated the procedure well. The quality                            of the bowel preparation was good. The ileocecal                            valve, appendiceal orifice, and rectum were                            photographed. Scope In: 1:30:58 PM Scope Out: 1:40:42 PM Scope Withdrawal Time: 0 hours 7 minutes 35 seconds  Total Procedure Duration: 0 hours 9 minutes 44 seconds  Findings:                 Four sessile polyps were found in the descending                            colon and transverse colon. The polyps were 2 to 3                            mm in size. These polyps were removed with a cold                            snare.  Resection and retrieval were complete.                           The exam was otherwise without abnormality on                            direct and retroflexion views. Complications:            No immediate complications. Estimated blood loss:                            None. Estimated Blood Loss:     Estimated blood loss: none. Impression:               - Four 2 to 3 mm polyps in the descending colon and                            in the transverse colon, removed with a cold snare.  Resected and retrieved.                           - The examination was otherwise normal on direct                            and retroflexion views. Recommendation:           - Patient has a contact number available for                            emergencies. The signs and symptoms of potential                            delayed complications were discussed with the                            patient. Return to normal activities tomorrow.                            Written discharge instructions were provided to the                            patient.                           - Resume previous diet.                           - Continue present medications.                           You will receive a letter within 2-3 weeks with the                            pathology results and my final recommendations.                           If the polyp(s) is proven to be 'pre-cancerous' on                            pathology, you will need repeat colonoscopy in 5                            years. If the polyp(s) is NOT 'precancerous' on                            pathology then you should repeat colon cancer                            screening in 10 years with colonoscopy without need                            for colon cancer screening by any method prior to  then (including stool testing). Milus Banister, MD 08/03/2017 1:46:28 PM This report has been  signed electronically.

## 2017-08-03 NOTE — Patient Instructions (Signed)
YOU HAD AN ENDOSCOPIC PROCEDURE TODAY AT [facility]:   Refer to the procedure report that was given to you for any specific questions about what was found during the examination.  If the procedure report does not answer your questions, please call your gastroenterologist to clarify.  If you requested that your care partner not be given the details of your procedure findings, then the procedure report has been included in a sealed envelope for you to review at your convenience later.  YOU SHOULD EXPECT: Some feelings of bloating in the abdomen. Passage of more gas than usual.  Walking can help get rid of the air that was put into your GI tract during the procedure and reduce the bloating. If you had a lower endoscopy (such as a colonoscopy or flexible sigmoidoscopy) you may notice spotting of blood in your stool or on the toilet paper. If you underwent a bowel prep for your procedure, you may not have a normal bowel movement for a few days.  Please Note:  You might notice some irritation and congestion in your nose or some drainage.  This is from the oxygen used during your procedure.  There is no need for concern and it should clear up in a day or so.  SYMPTOMS TO REPORT IMMEDIATELY:   Following lower endoscopy (colonoscopy or flexible sigmoidoscopy):  Excessive amounts of blood in the stool  Significant tenderness or worsening of abdominal pains  Swelling of the abdomen that is new, acute  Fever of 100F or higher  For urgent or emergent issues, a gastroenterologist can be reached at any hour by calling [phone].   DIET:  We do recommend a small meal at first, but then you may proceed to your regular diet.  Drink plenty of fluids but you should avoid alcoholic beverages for 24 hours.  ACTIVITY:  You should plan to take it easy for the rest of today and you should NOT DRIVE or use heavy machinery until tomorrow (because of the sedation medicines used during the test).     FOLLOW UP: Our staff will call the number listed on your records the next business day following your procedure to check on you and address any questions or concerns that you may have regarding the information given to you following your procedure. If we do not reach you, we will leave a message.  However, if you are feeling well and you are not experiencing any problems, there is no need to return our call.  We will assume that you have returned to your regular daily activities without incident.  If any biopsies were taken you will be contacted by phone or by letter within the next 1-3 weeks.  Please call us at [phone] if you have not heard about the biopsies in 3 weeks.   Await for biopsy results to determine next repeat Colonoscopy screening Polyps (handout given)  SIGNATURES/CONFIDENTIALITY: You and/or your care partner have signed paperwork which will be entered into your electronic medical record.  These signatures attest to the fact that that the information above on your After Visit Summary has been reviewed and is understood.  Full responsibility of the confidentiality of this discharge information lies with you and/or your care-partner.

## 2017-08-03 NOTE — Progress Notes (Signed)
Pt's states no medical changes since previsit or office visit. Patient had a Blepharoplasty on 07/27/17

## 2017-08-06 ENCOUNTER — Telehealth: Payer: Self-pay | Admitting: *Deleted

## 2017-08-06 NOTE — Telephone Encounter (Signed)
  Follow up Call-  Call back number 08/03/2017  Post procedure Call Back phone  # [phone]  Permission to leave phone message Yes  Some recent data might be hidden     Patient questions:  Do you have a fever, pain , or abdominal swelling? No. Pain Score  0 *  Have you tolerated food without any problems? Yes.    Have you been able to return to your normal activities? Yes.    Do you have any questions about your discharge instructions: Diet   No. Medications  No. Follow up visit  No.  Do you have questions or concerns about your Care? No.  Actions: * If pain score is 4 or above: No action needed, pain <4.

## 2017-08-10 ENCOUNTER — Encounter: Payer: Self-pay | Admitting: Gastroenterology

## 2017-09-10 DIAGNOSIS — R69 Illness, unspecified: Secondary | ICD-10-CM | POA: Diagnosis not present

## 2017-09-17 ENCOUNTER — Telehealth: Payer: Self-pay

## 2017-09-17 ENCOUNTER — Ambulatory Visit (INDEPENDENT_AMBULATORY_CARE_PROVIDER_SITE_OTHER): Payer: Medicare Other | Admitting: Family Medicine

## 2017-09-17 ENCOUNTER — Other Ambulatory Visit: Payer: Self-pay

## 2017-09-17 ENCOUNTER — Encounter: Payer: Self-pay | Admitting: Family Medicine

## 2017-09-17 ENCOUNTER — Encounter: Payer: Self-pay | Admitting: General Practice

## 2017-09-17 VITALS — BP 121/81 | HR 76 | Temp 98.0°F | Resp 16 | Ht 66.0 in | Wt 169.1 lb

## 2017-09-17 DIAGNOSIS — Z0000 Encounter for general adult medical examination without abnormal findings: Secondary | ICD-10-CM

## 2017-09-17 DIAGNOSIS — Z1231 Encounter for screening mammogram for malignant neoplasm of breast: Secondary | ICD-10-CM

## 2017-09-17 DIAGNOSIS — Z23 Encounter for immunization: Secondary | ICD-10-CM

## 2017-09-17 DIAGNOSIS — I10 Essential (primary) hypertension: Secondary | ICD-10-CM | POA: Diagnosis not present

## 2017-09-17 LAB — BASIC METABOLIC PANEL
BUN: 13 mg/dL (ref 6–23)
CHLORIDE: 104 meq/L (ref 96–112)
CO2: 27 mEq/L (ref 19–32)
Calcium: 9.6 mg/dL (ref 8.4–10.5)
Creatinine, Ser: 0.84 mg/dL (ref 0.40–1.20)
GFR: 71.54 mL/min (ref >60.00)
GLUCOSE: 95 mg/dL (ref 70–99)
POTASSIUM: 5 meq/L (ref 3.5–5.1)
Sodium: 140 mEq/L (ref 135–145)

## 2017-09-17 LAB — CBC WITH DIFFERENTIAL/PLATELET
BASOS PCT: 1.1 % (ref 0.0–3.0)
Basophils Absolute: 0.1 10*3/uL (ref 0.0–0.1)
EOS PCT: 2.9 % (ref 0.0–5.0)
Eosinophils Absolute: 0.2 10*3/uL (ref 0.0–0.7)
HEMATOCRIT: 43.2 % (ref 36.0–46.0)
HEMOGLOBIN: 14.9 g/dL (ref 12.0–15.0)
Lymphocytes Relative: 25.5 % (ref 12.0–46.0)
Lymphs Abs: 1.5 10*3/uL (ref 0.7–4.0)
MCHC: 34.4 g/dL (ref 30.0–36.0)
MCV: 104.1 fl — AB (ref 78.0–100.0)
MONOS PCT: 8.7 % (ref 3.0–12.0)
Monocytes Absolute: 0.5 10*3/uL (ref 0.1–1.0)
Neutro Abs: 3.6 10*3/uL (ref 1.4–7.7)
Neutrophils Relative %: 61.8 % (ref 43.0–77.0)
Platelets: 173 10*3/uL (ref 150.0–400.0)
RBC: 4.15 Mil/uL (ref 3.87–5.11)
RDW: 12.5 % (ref 11.5–15.5)
WBC: 5.8 10*3/uL (ref 4.0–10.5)

## 2017-09-17 LAB — LIPID PANEL
Cholesterol: 203 mg/dL — ABNORMAL HIGH (ref 0–200)
HDL: 53.5 mg/dL (ref >39.00)
LDL CALC: 137 mg/dL — AB (ref 0–99)
NonHDL: 149.37
TRIGLYCERIDES: 62 mg/dL (ref 0.0–149.0)
Total CHOL/HDL Ratio: 4
VLDL: 12.4 mg/dL (ref 0.0–40.0)

## 2017-09-17 LAB — HEPATIC FUNCTION PANEL
ALBUMIN: 4.5 g/dL (ref 3.5–5.2)
ALT: 20 U/L (ref 0–35)
AST: 22 U/L (ref 0–37)
Alkaline Phosphatase: 70 U/L (ref 39–117)
Bilirubin, Direct: 0.1 mg/dL (ref 0.0–0.3)
TOTAL PROTEIN: 7 g/dL (ref 6.0–8.3)
Total Bilirubin: 0.5 mg/dL (ref 0.2–1.2)

## 2017-09-17 LAB — TSH: TSH: 1.07 u[IU]/mL (ref 0.35–4.50)

## 2017-09-17 NOTE — Progress Notes (Signed)
   Subjective:    Patient ID: Elaine Lambert, female    DOB: Jan 28, 1951, 67 y.o.   MRN: [id number]  HPI CPE- UTD on colonoscopy, due for mammo at end of the month.  UTD on DEXA.  UTD on Prevnar, due for pneumovax.  Pt is down 7 lbs since December.  Exercising regularly.   Review of Systems Patient reports no vision/ hearing changes, adenopathy,fever, weight change,  persistant/recurrent hoarseness , swallowing issues, chest pain, palpitations, edema, persistant/recurrent cough, hemoptysis, dyspnea (rest/exertional/paroxysmal nocturnal), gastrointestinal bleeding (melena, rectal bleeding), abdominal pain, significant heartburn, bowel changes, GU symptoms (dysuria, hematuria, incontinence), Gyn symptoms (abnormal  bleeding, pain),  syncope, focal weakness, memory loss, numbness & tingling, skin/hair/nail changes, abnormal bruising or bleeding, anxiety, or depression.     Objective:   Physical Exam General Appearance:    Alert, cooperative, no distress, appears stated age  Head:    Normocephalic, without obvious abnormality, atraumatic  Eyes:    PERRL, conjunctiva/corneas clear, EOM's intact, fundi    benign, both eyes  Ears:    Normal TM's and external ear canals, both ears  Nose:   Nares normal, septum midline, mucosa normal, no drainage    or sinus tenderness  Throat:   Lips, mucosa, and tongue normal; teeth and gums normal  Neck:   Supple, symmetrical, trachea midline, no adenopathy;    Thyroid: no enlargement/tenderness/nodules  Back:     Symmetric, no curvature, ROM normal, no CVA tenderness  Lungs:     Clear to auscultation bilaterally, respirations unlabored  Chest Wall:    No tenderness or deformity   Heart:    Regular rate and rhythm, S1 and S2 normal, no murmur, rub   or gallop  Breast Exam:    Deferred to mammo  Abdomen:     Soft, non-tender, bowel sounds active all four quadrants,    no masses, no organomegaly  Genitalia:    Deferred  Rectal:    Extremities:   Extremities  normal, atraumatic, no cyanosis or edema  Pulses:   2+ and symmetric all extremities  Skin:   Skin color, texture, turgor normal, no rashes or lesions  Lymph nodes:   Cervical, supraclavicular, and axillary nodes normal  Neurologic:   CNII-XII intact, normal strength, sensation and reflexes    throughout          Assessment & Plan:

## 2017-09-17 NOTE — Assessment & Plan Note (Signed)
Pt has hx of elevated BP but has been able to control w/ diet and exercise.  Check labs and continue to monitor.

## 2017-09-17 NOTE — Addendum Note (Signed)
Addended by: Davis Gourd on: 09/17/2017 10:13 AM   Modules accepted: Orders

## 2017-09-17 NOTE — Assessment & Plan Note (Signed)
Pt's PE WNL.  UTD on colonoscopy.  Plans to schedule mammo.  Pneumovax given today- now UTD on immunizations.  Check labs.  Anticipatory guidance provided.

## 2017-09-17 NOTE — Patient Instructions (Signed)
Schedule your Medicare Wellness Visit in 6 months w/ Elaine Lambert and follow up w/ me at the same time to recheck BP and cholesterol We'll notify you of your lab results and make any changes if needed Continue to work on healthy diet and regular exercise- you look great! Call with any questions or concerns Happy New Year!!!

## 2017-09-17 NOTE — Telephone Encounter (Signed)
Copied from [place] [id number]. Topic: Medicare AWV >> Sep 17, 2017  3:08 PM Arletha Grippe wrote: Reason for CRM: pt had written on paper that she is to have awv in august.  It is not on her my chart - pt does not want to do awv at ffice. She will call [facility] and have them do awv.   PT DECLINES AWV

## 2017-10-05 ENCOUNTER — Ambulatory Visit: Payer: Medicare Other

## 2017-10-12 ENCOUNTER — Ambulatory Visit
Admission: RE | Admit: 2017-10-12 | Discharge: 2017-10-12 | Disposition: A | Discharge status: Home / Self Care | Payer: Medicare Other | Source: Ambulatory Visit | Attending: Family Medicine | Admitting: Family Medicine

## 2017-10-12 DIAGNOSIS — Z1231 Encounter for screening mammogram for malignant neoplasm of breast: Secondary | ICD-10-CM

## 2018-03-20 DIAGNOSIS — R69 Illness, unspecified: Secondary | ICD-10-CM | POA: Diagnosis not present

## 2018-04-05 ENCOUNTER — Ambulatory Visit: Payer: Medicare Other | Admitting: Family Medicine

## 2018-04-05 ENCOUNTER — Other Ambulatory Visit: Payer: Self-pay

## 2018-04-05 ENCOUNTER — Ambulatory Visit (INDEPENDENT_AMBULATORY_CARE_PROVIDER_SITE_OTHER): Payer: Medicare HMO | Admitting: Family Medicine

## 2018-04-05 ENCOUNTER — Encounter: Payer: Self-pay | Admitting: Family Medicine

## 2018-04-05 DIAGNOSIS — Z23 Encounter for immunization: Secondary | ICD-10-CM

## 2018-04-05 DIAGNOSIS — E785 Hyperlipidemia, unspecified: Secondary | ICD-10-CM | POA: Diagnosis not present

## 2018-04-05 LAB — LIPID PANEL
CHOLESTEROL: 214 mg/dL — AB (ref 0–200)
HDL: 50.2 mg/dL (ref >39.00)
LDL Cholesterol: 140 mg/dL — ABNORMAL HIGH (ref 0–99)
NonHDL: 164.17
Total CHOL/HDL Ratio: 4
Triglycerides: 119 mg/dL (ref 0.0–149.0)
VLDL: 23.8 mg/dL (ref 0.0–40.0)

## 2018-04-05 LAB — HEPATIC FUNCTION PANEL
ALBUMIN: 4.5 g/dL (ref 3.5–5.2)
ALT: 29 U/L (ref 0–35)
AST: 34 U/L (ref 0–37)
Alkaline Phosphatase: 67 U/L (ref 39–117)
Bilirubin, Direct: 0.1 mg/dL (ref 0.0–0.3)
TOTAL PROTEIN: 6.9 g/dL (ref 6.0–8.3)
Total Bilirubin: 0.5 mg/dL (ref 0.2–1.2)

## 2018-04-05 NOTE — Patient Instructions (Signed)
Schedule your complete physical in 6 months We'll notify you of your lab results and make any changes if needed Continue to work on healthy diet and regular exercise- you can do it! Call with any questions or concerns Enjoy the rest of your summer!

## 2018-04-05 NOTE — Assessment & Plan Note (Signed)
Noted on last labs.  Attempting to control w/ diet and exercise.  Check labs and start meds prn.  Pt expressed understanding and is in agreement w/ plan.

## 2018-04-05 NOTE — Progress Notes (Signed)
   Subjective:    Patient ID: Elaine Lambert, female    DOB: 03-08-1951, 67 y.o.   MRN: [id number]  HPI Hyperlipidemia- chronic problem, attempting to control w/ diet and exercise.  Not on medication.  Last LDL 137.  Exercising regularly.  Pt is now on Keto diet.  No CP, SOB, HAs, abd pain, N/V.   Review of Systems For ROS see HPI     Objective:   Physical Exam  Constitutional: She is oriented to person, place, and time. She appears well-developed and well-nourished. No distress.  HENT:  Head: Normocephalic and atraumatic.  Eyes: Pupils are equal, round, and reactive to light. Conjunctivae and EOM are normal.  Neck: Normal range of motion. Neck supple. No thyromegaly present.  Cardiovascular: Normal rate, regular rhythm, normal heart sounds and intact distal pulses.  No murmur heard. Pulmonary/Chest: Effort normal and breath sounds normal. No respiratory distress.  Abdominal: Soft. She exhibits no distension. There is no tenderness.  Musculoskeletal: She exhibits no edema.  Lymphadenopathy:    She has no cervical adenopathy.  Neurological: She is alert and oriented to person, place, and time.  Skin: Skin is warm and dry.  Psychiatric: She has a normal mood and affect. Her behavior is normal.  Vitals reviewed.         Assessment & Plan:

## 2018-04-08 ENCOUNTER — Other Ambulatory Visit: Payer: Self-pay | Admitting: General Practice

## 2018-04-08 DIAGNOSIS — E785 Hyperlipidemia, unspecified: Secondary | ICD-10-CM

## 2018-04-08 MED ORDER — ATORVASTATIN CALCIUM 20 MG PO TABS: 20.0000 mg | ORAL_TABLET | Freq: Every day | ORAL | 3 refills | Status: DC

## 2018-07-03 DIAGNOSIS — J208 Acute bronchitis due to other specified organisms: Secondary | ICD-10-CM | POA: Diagnosis not present

## 2018-07-22 ENCOUNTER — Ambulatory Visit (HOSPITAL_BASED_OUTPATIENT_CLINIC_OR_DEPARTMENT_OTHER)
Admission: RE | Admit: 2018-07-22 | Discharge: 2018-07-22 | Disposition: A | Discharge status: Home / Self Care | Payer: Medicare HMO | Source: Ambulatory Visit | Attending: Family Medicine | Admitting: Family Medicine

## 2018-07-22 ENCOUNTER — Encounter: Payer: Self-pay | Admitting: Family Medicine

## 2018-07-22 ENCOUNTER — Ambulatory Visit (INDEPENDENT_AMBULATORY_CARE_PROVIDER_SITE_OTHER): Payer: Medicare HMO | Admitting: Family Medicine

## 2018-07-22 ENCOUNTER — Other Ambulatory Visit: Payer: Self-pay

## 2018-07-22 ENCOUNTER — Other Ambulatory Visit: Payer: Self-pay | Admitting: Family Medicine

## 2018-07-22 VITALS — BP 120/80 | HR 81 | Temp 98.5°F | Resp 17 | Ht 66.0 in | Wt 161.5 lb

## 2018-07-22 DIAGNOSIS — R9389 Abnormal findings on diagnostic imaging of other specified body structures: Secondary | ICD-10-CM

## 2018-07-22 DIAGNOSIS — R05 Cough: Secondary | ICD-10-CM | POA: Insufficient documentation

## 2018-07-22 DIAGNOSIS — J189 Pneumonia, unspecified organism: Secondary | ICD-10-CM

## 2018-07-22 DIAGNOSIS — R059 Cough, unspecified: Secondary | ICD-10-CM

## 2018-07-22 MED ORDER — ATORVASTATIN CALCIUM 20 MG PO TABS: 20.0000 mg | ORAL_TABLET | Freq: Every day | ORAL | 3 refills | Status: DC

## 2018-07-22 MED ORDER — ALBUTEROL SULFATE HFA 108 (90 BASE) MCG/ACT IN AERS: 2.0000 | INHALATION_SPRAY | Freq: Four times a day (QID) | RESPIRATORY_TRACT | 0 refills | Status: DC | PRN

## 2018-07-22 MED ORDER — ALBUTEROL SULFATE (2.5 MG/3ML) 0.083% IN NEBU
2.5000 mg | INHALATION_SOLUTION | Freq: Once | RESPIRATORY_TRACT | Status: AC
  Administered 2018-07-22: 2.5 mg via RESPIRATORY_TRACT

## 2018-07-22 MED ORDER — AZITHROMYCIN 250 MG PO TABS: ORAL_TABLET | ORAL | 0 refills | Status: DC

## 2018-07-22 NOTE — Progress Notes (Signed)
   Subjective:    Patient ID: Elaine Lambert, female    DOB: Jul 14, 1951, 67 y.o.   MRN: [id number]  HPI Cough- pt reports sxs started as 'a head cold' ~6 weeks ago.  Cough persisted so she went to UC ~1 month ago and was given 5 days of Prednisone.  This improved her energy but did not help cough.  Cough continues- dry, worse at night.  No fevers.  Some intermittent chills.  Pt continues to feel poorly.   Review of Systems For ROS see HPI     Objective:   Physical Exam  Constitutional: She appears well-developed and well-nourished. No distress.  HENT:  Head: Normocephalic and atraumatic.  TMs normal bilaterally Mild nasal congestion Throat w/out erythema, edema, or exudate  Eyes: Pupils are equal, round, and reactive to light. Conjunctivae and EOM are normal.  Neck: Normal range of motion. Neck supple.  Cardiovascular: Normal rate, regular rhythm, normal heart sounds and intact distal pulses.  No murmur heard. Pulmonary/Chest: Effort normal. No respiratory distress. She has no wheezes.  No cough heard during visit Decreased breath sounds throughout- Air movement improved s/p neb tx in office  Lymphadenopathy:    She has no cervical adenopathy.  Vitals reviewed.         Assessment & Plan:  Cough- pt has had cough x6 weeks.  No improvement w/ 5 days of Prednisone ~1 month ago.  Given duration of cough, will get CXR to assess.  Based on CXR results, will determine if she requires abx, prednisone, or further work up.  Start Albuterol inhaler, cough meds prn.  Reviewed supportive care and red flags that should prompt return.  Pt expressed understanding and is in agreement w/ plan.

## 2018-07-22 NOTE — Patient Instructions (Addendum)
Go to the [facility] and get your chest xray Use the Albuterol inhaler- 2 puffs every 4 hrs as needed for cough, shortness of breath Once we have your chest xray results, we'll determine the next step Drink plenty of fluids REST! Add Mucinex DM to help w/ cough/congestion Continue to eat even if it's small amounts and you don't feel like it If no improvement in diarrhea or abdominal issues, let me know! Call with any questions or concerns Hang in there!!!

## 2018-08-05 ENCOUNTER — Ambulatory Visit (INDEPENDENT_AMBULATORY_CARE_PROVIDER_SITE_OTHER): Payer: Medicare HMO | Admitting: Family Medicine

## 2018-08-05 ENCOUNTER — Other Ambulatory Visit: Payer: Self-pay

## 2018-08-05 ENCOUNTER — Encounter: Payer: Self-pay | Admitting: Family Medicine

## 2018-08-05 ENCOUNTER — Ambulatory Visit: Payer: Self-pay

## 2018-08-05 VITALS — BP 142/88 | HR 101 | Temp 98.3°F | Ht 66.0 in | Wt 159.0 lb

## 2018-08-05 DIAGNOSIS — R197 Diarrhea, unspecified: Secondary | ICD-10-CM

## 2018-08-05 LAB — COMPREHENSIVE METABOLIC PANEL
ALT: 10 U/L (ref 0–35)
AST: 15 U/L (ref 0–37)
Albumin: 3.9 g/dL (ref 3.5–5.2)
Alkaline Phosphatase: 52 U/L (ref 39–117)
BUN: 9 mg/dL (ref 6–23)
CO2: 27 mEq/L (ref 19–32)
Calcium: 9.7 mg/dL (ref 8.4–10.5)
Chloride: 100 mEq/L (ref 96–112)
Creatinine, Ser: 0.55 mg/dL (ref 0.40–1.20)
GFR: 116.32 mL/min (ref >60.00)
Glucose, Bld: 87 mg/dL (ref 70–99)
Potassium: 3.6 mEq/L (ref 3.5–5.1)
Sodium: 137 mEq/L (ref 135–145)
Total Bilirubin: 0.5 mg/dL (ref 0.2–1.2)
Total Protein: 7.3 g/dL (ref 6.0–8.3)

## 2018-08-05 LAB — CBC WITH DIFFERENTIAL/PLATELET
Basophils Absolute: 0.1 10*3/uL (ref 0.0–0.1)
Basophils Relative: 0.8 % (ref 0.0–3.0)
Eosinophils Absolute: 0.1 10*3/uL (ref 0.0–0.7)
Eosinophils Relative: 1.8 % (ref 0.0–5.0)
HCT: 36.4 % (ref 36.0–46.0)
Hemoglobin: 12.3 g/dL (ref 12.0–15.0)
LYMPHS ABS: 0.8 10*3/uL (ref 0.7–4.0)
Lymphocytes Relative: 11 % — ABNORMAL LOW (ref 12.0–46.0)
MCHC: 33.9 g/dL (ref 30.0–36.0)
MCV: 97.6 fl (ref 78.0–100.0)
Monocytes Absolute: 0.8 10*3/uL (ref 0.1–1.0)
Monocytes Relative: 10 % (ref 3.0–12.0)
NEUTROS ABS: 5.9 10*3/uL (ref 1.4–7.7)
Neutrophils Relative %: 76.4 % (ref 43.0–77.0)
Platelets: 324 10*3/uL (ref 150.0–400.0)
RBC: 3.73 Mil/uL — ABNORMAL LOW (ref 3.87–5.11)
RDW: 12.4 % (ref 11.5–15.5)
WBC: 7.7 10*3/uL (ref 4.0–10.5)

## 2018-08-05 MED ORDER — CIPROFLOXACIN HCL 500 MG PO TABS: 500.0000 mg | ORAL_TABLET | Freq: Two times a day (BID) | ORAL | 0 refills | Status: AC

## 2018-08-05 NOTE — Patient Instructions (Signed)
Please return in 1-2 weeks with Dr. Birdie Riddle for recheck.   Take the antibiotics and immodium as needed to see if that helps resolve the problem.   If you have any questions or concerns, please don't hesitate to send me a message via MyChart or call the office at [phone]. Thank you for visiting with [place] today! It's our pleasure caring for you.   Diarrhea, Adult Diarrhea is frequent loose and watery bowel movements. Diarrhea can make you feel weak and cause you to become dehydrated. Dehydration can make you tired and thirsty, cause you to have a dry mouth, and decrease how often you urinate. Diarrhea typically lasts 2-3 days. However, it can last longer if it is a sign of something more serious. It is important to treat your diarrhea as told by your health care provider. Follow these instructions at home: Eating and drinking  Follow these recommendations as told by your health care provider:  Take an oral rehydration solution (ORS). This is a drink that is sold at pharmacies and retail stores.  Drink clear fluids, such as water, ice chips, diluted fruit juice, and low-calorie sports drinks.  Eat bland, easy-to-digest foods in small amounts as you are able. These foods include bananas, applesauce, rice, lean meats, toast, and crackers.  Avoid drinking fluids that contain a lot of sugar or caffeine, such as energy drinks, sports drinks, and soda.  Avoid alcohol.  Avoid spicy or fatty foods.  General instructions  Drink enough fluid to keep your urine clear or pale yellow.  Wash your hands often. If soap and water are not available, use hand sanitizer.  Make sure that all people in your household wash their hands well and often.  Take over-the-counter and prescription medicines only as told by your health care provider.  Rest at home while you recover.  Watch your condition for any changes.  Take a warm bath to relieve any burning or pain from frequent diarrhea episodes.  Keep  all follow-up visits as told by your health care provider. This is important. Contact a health care provider if:  You have a fever.  Your diarrhea gets worse.  You have new symptoms.  You cannot keep fluids down.  You feel light-headed or dizzy.  You have a headache  You have muscle cramps. Get help right away if:  You have chest pain.  You feel extremely weak or you faint.  You have bloody or black stools or stools that look like tar.  You have severe pain, cramping, or bloating in your abdomen.  You have trouble breathing or you are breathing very quickly.  Your heart is beating very quickly.  Your skin feels cold and clammy.  You feel confused.  You have signs of dehydration, such as: ? Dark urine, very little urine, or no urine. ? Cracked lips. ? Dry mouth. ? Sunken eyes. ? Sleepiness. ? Weakness. This information is not intended to replace advice given to you by your health care provider. Make sure you discuss any questions you have with your health care provider. Document Released: 07/28/2002 Document Revised: 12/16/2015 Document Reviewed: 04/13/2015 Elsevier Interactive Patient Education  [employer].

## 2018-08-05 NOTE — Progress Notes (Signed)
Subjective  CC:  Chief Complaint  Patient presents with  . Diarrhea    x 4 weeks, she states she takes Immodium and it goes away but comes back, stomach cramps     HPI: Elaine Lambert is a 67 y.o. female who presents to the office today to address the problems listed above in the chief complaint. Same day acute visit; PCP not available. New pt to me. Chart reviewed.    67 year old female presents with approximately 6-week history of loose stools and mucoid diarrhea.  She reports it started with the onset of her cough illness.  I reviewed her chart.  She had been seen for a cough related illness in urgent care and treated with prednisone.  Then due to persistent cough, followed up here with Dr. Birdie Riddle.  Chest x-ray showed probable infiltrate and she was treated with Z-Pak.  She does feel that the cough has improved.  However throughout the illness, loose watery stools have worsened.  Up to 4 times per day.  Diarrhea even if she does not eat.  It is responsive to Imodium.  Nonbloody but complains of mucus.  She is never had this problem before.  No history of colitis or inflammatory bowel disease.  Was only treated with a Z-Pak, no prior antibiotics.  No recent travel.  Otherwise feels well although her appetite is down.  Denies nausea vomiting or significant abdominal pain although she will have some mild cramping.  No sick contacts Assessment  1. Diarrhea of presumed infectious origin      Plan   Diarrhea, possibly infectious: Check blood work, stool studies, treat empirically with Cipro for 1 week and then follow-up in the office.  Continue Imodium as needed.  Continue with oral hydration.  She appears well.  Education given.  Further work-up pending results and if no improvement happens, will need imaging and/or colonoscopy.  Patient understands and agrees with care plan  Follow up: 1 to 2 weeks with Dr. Birdie Riddle for follow-up. 10/25/2018  Orders Placed This Encounter    Procedures  . Stool culture  . Ova and parasite examination  . Clostridium difficile Toxin B, Qualitative, Real-Time PCR  . Comprehensive metabolic panel  . CBC with Differential/Platelet   Meds ordered this encounter  Medications  . ciprofloxacin (CIPRO) 500 MG tablet    Sig: Take 1 tablet (500 mg total) by mouth 2 (two) times daily for 7 days.    Dispense:  14 tablet    Refill:  0      I reviewed the patients updated PMH, FH, and SocHx.    Patient Active Problem List   Diagnosis Date Noted  . Hyperlipidemia 04/05/2018  . 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 Vertigo 12/10/2013  . Nausea with vomiting 12/10/2013  . Routine general medical examination at a health care facility 12/02/2012  . HTN (hypertension) 07/12/2012   Current Meds  Medication Sig  . acetaminophen (TYLENOL) 325 MG tablet Take 650 mg by mouth every 6 (six) hours as needed.  Marland Kitchen albuterol (PROVENTIL HFA;VENTOLIN HFA) 108 (90 Base) MCG/ACT inhaler Inhale 2 puffs into the lungs every 6 (six) hours as needed for wheezing or shortness of breath.  Marland Kitchen atorvastatin (LIPITOR) 20 MG tablet Take 1 tablet (20 mg total) by mouth daily.  . cycloSPORINE (RESTASIS) 0.05 % ophthalmic emulsion Place 1 drop into both eyes 2 (two) times daily.  . fluticasone (FLONASE) 50 MCG/ACT nasal spray Place 1 spray into both nostrils 2 (two)  times daily.  . [DISCONTINUED] azithromycin (ZITHROMAX) 250 MG tablet Take 2 tablets on day 2, then 1 tablet on days 2 through 5.    Allergies: Patient is allergic to hydrocodone. Family History: Patient family history includes Atrial fibrillation in her brother; Cancer in her mother; Gout in her brother; Liver cancer in her father; Lung cancer in her father; Parkinson's disease in her mother; Prostate cancer in her brother; Testicular cancer in her brother. Social History:  Patient  reports that she quit smoking about 29 years ago. She has a 10.00 pack-year smoking history. She has never  used smokeless tobacco. She reports current alcohol use of about 1.0 - 2.0 standard drinks of alcohol per week. She reports that she does not use drugs.  Review of Systems: Constitutional: Negative for fever malaise or anorexia Cardiovascular: negative for chest pain Respiratory: negative for SOB or persistent cough Gastrointestinal: negative for abdominal pain  Objective  Vitals: BP (!) 142/88   Pulse (!) 101   Temp 98.3 F (36.8 C)   Ht 5\' 6"  (1.676 m)   Wt 159 lb (72.1 kg)   LMP 08/21/1997 (Approximate)   SpO2 99%   BMI 25.66 kg/m  General: no acute distress , A&Ox3, appears well HEENT: PEERL, conjunctiva normal, Oropharynx moist,neck is supple Cardiovascular:  RRR without murmur or gallop.  Respiratory:  Good breath sounds bilaterally, CTAB with normal respiratory effort Gastrointestinal: soft, distended abdomen, normal active bowel sounds, no palpable masses, no hepatosplenomegaly, no appreciated hernias, nontender Skin:  Warm, no rashes     Commons side effects, risks, benefits, and alternatives for medications and treatment plan prescribed today were discussed, and the patient expressed understanding of the given instructions. Patient is instructed to call or message via MyChart if he/she has any questions or concerns regarding our treatment plan. No barriers to understanding were identified. We discussed Red Flag symptoms and signs in detail. Patient expressed understanding regarding what to do in case of urgent or emergency type symptoms.   Medication list was reconciled, printed and provided to the patient in AVS. Patient instructions and summary information was reviewed with the patient as documented in the AVS. This note was prepared with assistance of Dragon voice recognition software. Occasional wrong-word or sound-a-like substitutions may have occurred due to the inherent limitations of voice recognition software

## 2018-08-05 NOTE — Telephone Encounter (Signed)
Pt c/o mild watery diarrhea x 1 month. Pt stated that she sees mucous in her stool. Pt has tried Imodium which stops the diarrhea for a couple pf days then the diarrhea returns. Pt denies pain. Pt stated she is having abdominal pain. Pt has been drinking water. No vomiting. Pt last urinated this morning. Pt took a Z pack 2 weeks ago for "walking pneumonia." Pt denies fever or blood in stool. Pt stated she would like to be seen today. No available appointments today with PCP. Appt made today with Dr Jonni Sanger. Pt given care advice and pt verbalized understanding.  Reason for Disposition . [1] Mucus or pus in stool AND [2] present > 2 days AND [3] diarrhea is more than mild  Answer Assessment - Initial Assessment Questions 1. DIARRHEA SEVERITY: "How bad is the diarrhea?" "How many extra stools have you had in the past 24 hours than normal?"    - NO DIARRHEA (SCALE 0)   - MILD (SCALE 1-3): Few loose or mushy BMs; increase of 1-3 stools over normal daily number of stools; mild increase in ostomy output.   -  MODERATE (SCALE 4-7): Increase of 4-6 stools daily over normal; moderate increase in ostomy output. * SEVERE (SCALE 8-10; OR 'WORST POSSIBLE'): Increase of 7 or more stools daily over normal; moderate increase in ostomy output; incontinence.   mild 2. ONSET: "When did the diarrhea begin?"      1 month 3. BM CONSISTENCY: "How loose or watery is the diarrhea?"      watery 4. VOMITING: "Are you also vomiting?" If so, ask: "How many times in the past 24 hours?"      no 5. ABDOMINAL PAIN: "Are you having any abdominal pain?" If yes: "What does it feel like?" (e.g., crampy, dull, intermittent, constant)      Bloating no pain 6. ABDOMINAL PAIN SEVERITY: If present, ask: "How bad is the pain?"  (e.g., Scale 1-10; mild, moderate, or severe)   - MILD (1-3): doesn't interfere with normal activities, abdomen soft and not tender to touch    - MODERATE (4-7): interferes with normal activities or awakens from  sleep, tender to touch    - SEVERE (8-10): excruciating pain, doubled over, unable to do any normal activities       No pain 7. ORAL INTAKE: If vomiting, "Have you been able to drink liquids?" "How much fluids have you had in the past 24 hours?"     N/a 8. HYDRATION: "Any signs of dehydration?" (e.g., dry mouth [not just dry lips], too weak to stand, dizziness, new weight loss) "When did you last urinate?"     No- this morning 9. EXPOSURE: "Have you traveled to a foreign country recently?" "Have you been exposed to anyone with diarrhea?" "Could you have eaten any food that was spoiled?"     No-no exposure-no spoiled food 10. ANTIBIOTIC USE: "Are you taking antibiotics now or have you taken antibiotics in the past 2 months?"      2 weeks ago took antibiotics- Zpack 11. OTHER SYMPTOMS: "Do you have any other symptoms?" (e.g., fever, blood in stool)       no 12. PREGNANCY: "Is there any chance you are pregnant?" "When was your last menstrual period?"       n/a  Protocols used: DIARRHEA-A-AH

## 2018-08-06 DIAGNOSIS — R197 Diarrhea, unspecified: Secondary | ICD-10-CM | POA: Diagnosis not present

## 2018-08-12 ENCOUNTER — Encounter: Payer: Self-pay | Admitting: *Deleted

## 2018-08-12 ENCOUNTER — Ambulatory Visit: Payer: Self-pay | Admitting: *Deleted

## 2018-08-12 NOTE — Telephone Encounter (Signed)
This encounter was created in error - please disregard.

## 2018-08-12 NOTE — Telephone Encounter (Signed)
Pt calling with complaints of abdominal pain " all over" abdomen that comes and goes. Pt states it as cramps "like a cold in the stomach". Pt states she can't eat without feeling like she has to throw up and feels like she belches all the time, which usually doesn't occur. Pt was seen on 08/05/18 with complaints of diarrhea and was prescribed an antibiotic and was told to try imodium to help with diarrhea. Pt states that her stomach is not as swollen as it was previously.  Pt states has not had any bowel movements today but did have 4 episodes on yesterday in which she did take 2 imodium. Pt states she has lost almost 10 lbs.  Pt states when she eats she is only able to take a couple of bites because she feels like she can't swallow. Pt states she was able to tolerate eating cereal and 1/2 of a peanut butter sand which today. Pt states she has had nausea, but not emesis. Pt offered appt today at 3 pm but pt is unable to make due to being at work. Pt scheduled for appt tomorrow at 8 am. Pt advised that is she becomes worse before scheduled appt to return call to the office or to seek treatment at Urgent Care/ED. Pt verbalized understanding.  Reason for Disposition . [1] MODERATE pain (e.g., interferes with normal activities) AND [2] pain comes and goes (cramps) AND [3] present > 24 hours  (Exception: pain with Vomiting or Diarrhea - see that Guideline)  Answer Assessment - Initial Assessment Questions 1. LOCATION: "Where does it hurt?"      All over stomach 2. RADIATION: "Does the pain shoot anywhere else?" (e.g., chest, back)     No 3. ONSET: "When did the pain begin?" (e.g., minutes, hours or days ago)      Over the past coupe of weeks 4. SUDDEN: "Gradual or sudden onset?"     gradual 5. PATTERN "Does the pain come and go, or is it constant?"    - If constant: "Is it getting better, staying the same, or worsening?"      (Note: Constant means the pain never goes away completely; most serious pain is  constant and it progresses)     - If intermittent: "How long does it last?" "Do you have pain now?"     (Note: Intermittent means the pain goes away completely between bouts)     Comes and goes 6. SEVERITY: "How bad is the pain?"  (e.g., Scale 1-10; mild, moderate, or severe)   - MILD (1-3): doesn't interfere with normal activities, abdomen soft and not tender to touch    - MODERATE (4-7): interferes with normal activities or awakens from sleep, tender to touch    - SEVERE (8-10): excruciating pain, doubled over, unable to do any normal activities      When it comes it is 5-6 7. RECURRENT SYMPTOM: "Have you ever had this type of abdominal pain before?" If so, ask: "When was the last time?" and "What happened that time?"      Yes was treated with abx on 08/05/18 and has been taking imodium with little improvement 8. CAUSE: "What do you think is causing the abdominal pain?"     unknown 9. RELIEVING/AGGRAVATING FACTORS: "What makes it better or worse?" (e.g., movement, antacids, bowel movement)     No 10. OTHER SYMPTOMS: "Has there been any vomiting, diarrhea, constipation, or urine problems?"       Diarrhea but pt states she  has not had any bowel movements today 11. PREGNANCY: "Is there any chance you are pregnant?" "When was your last menstrual period?"       n/a  Protocols used: ABDOMINAL PAIN - [facility]

## 2018-08-13 ENCOUNTER — Ambulatory Visit (INDEPENDENT_AMBULATORY_CARE_PROVIDER_SITE_OTHER): Payer: Medicare HMO | Admitting: Physician Assistant

## 2018-08-13 ENCOUNTER — Encounter: Payer: Self-pay | Admitting: Physician Assistant

## 2018-08-13 ENCOUNTER — Other Ambulatory Visit: Payer: Self-pay

## 2018-08-13 ENCOUNTER — Ambulatory Visit: Payer: Medicare HMO | Admitting: Family Medicine

## 2018-08-13 VITALS — BP 136/84 | HR 100 | Temp 98.9°F | Resp 16 | Wt 156.6 lb

## 2018-08-13 DIAGNOSIS — K529 Noninfective gastroenteritis and colitis, unspecified: Secondary | ICD-10-CM

## 2018-08-13 DIAGNOSIS — K219 Gastro-esophageal reflux disease without esophagitis: Secondary | ICD-10-CM | POA: Diagnosis not present

## 2018-08-13 LAB — H. PYLORI ANTIBODY, IGG: H Pylori IgG: POSITIVE — AB

## 2018-08-13 LAB — CBC WITH DIFFERENTIAL/PLATELET
Basophils Absolute: 0.1 10*3/uL (ref 0.0–0.1)
Basophils Relative: 1.2 % (ref 0.0–3.0)
Eosinophils Absolute: 0.1 10*3/uL (ref 0.0–0.7)
Eosinophils Relative: 1.6 % (ref 0.0–5.0)
HCT: 37.1 % (ref 36.0–46.0)
Hemoglobin: 12.5 g/dL (ref 12.0–15.0)
Lymphocytes Relative: 13.5 % (ref 12.0–46.0)
Lymphs Abs: 1 10*3/uL (ref 0.7–4.0)
MCHC: 33.7 g/dL (ref 30.0–36.0)
MCV: 96.7 fl (ref 78.0–100.0)
Monocytes Absolute: 0.6 10*3/uL (ref 0.1–1.0)
Monocytes Relative: 9 % (ref 3.0–12.0)
NEUTROS PCT: 74.7 % (ref 43.0–77.0)
Neutro Abs: 5.4 10*3/uL (ref 1.4–7.7)
Platelets: 318 10*3/uL (ref 150.0–400.0)
RBC: 3.84 Mil/uL — ABNORMAL LOW (ref 3.87–5.11)
RDW: 12.4 % (ref 11.5–15.5)
WBC: 7.2 10*3/uL (ref 4.0–10.5)

## 2018-08-13 LAB — LIPASE: Lipase: 12 U/L (ref 11.0–59.0)

## 2018-08-13 MED ORDER — PANTOPRAZOLE SODIUM 40 MG PO TBEC: 40.0000 mg | DELAYED_RELEASE_TABLET | Freq: Every day | ORAL | 0 refills | Status: DC

## 2018-08-13 MED ORDER — SUCRALFATE 1 G PO TABS: 1.0000 g | ORAL_TABLET | Freq: Three times a day (TID) | ORAL | 0 refills | Status: DC

## 2018-08-13 NOTE — Patient Instructions (Signed)
Please go to the lab today for blood work.  I will call you with your results. We will alter treatment regimen(s) if indicated by your results.   Start the Pantoprazole daily as directed. Start the Carafate, taking as directed over the next week. Remember the Pepto Bismol will cause black stools.   I am setting you up with Gastroenterology for further assessment of chronic diarrhea.  Again we will alter treatment regimen based on results. We will be calling you Thursday!

## 2018-08-13 NOTE — Progress Notes (Signed)
Patient presents to clinic today c/o 2 months of abdominal pain and loose stools. Pain is described as intermittent, generalized but a lot of times is upper abdomen. Notes anorexia and nausea without vomiting. Notes several loose stools during the day. Denies melena, hematochezia or tenesmus. Was seen 08/05/18 by Dr. Jonni Sanger at which time lab assessment (cbc, cmp) and stool studies (culture, c diff and o/p) were obtained. Patient was given course of Cipro for potential infectious diarrhea while workup was in progress. Denies any improvement with regimen. Workup was unremarkable.  Denies history of ulcer but notes significant heartburn and indigestion, worse with meals. Not taking anything for this OTC.  Past Medical History:  Diagnosis Date  . Allergy   . Cataract   . Dry eyes, bilateral   . Hypertension 06/2012   off Meds 2015 since wt loss    Current Outpatient Medications on File Prior to Visit  Medication Sig Dispense Refill  . acetaminophen (TYLENOL) 325 MG tablet Take 650 mg by mouth every 6 (six) hours as needed.    Marland Kitchen albuterol (PROVENTIL HFA;VENTOLIN HFA) 108 (90 Base) MCG/ACT inhaler Inhale 2 puffs into the lungs every 6 (six) hours as needed for wheezing or shortness of breath. 1 Inhaler 0  . atorvastatin (LIPITOR) 20 MG tablet Take 1 tablet (20 mg total) by mouth daily. 30 tablet 3  . cetirizine (ZYRTEC) 5 MG tablet Take by mouth.    . cycloSPORINE (RESTASIS) 0.05 % ophthalmic emulsion Place 1 drop into both eyes 2 (two) times daily.    . fluticasone (FLONASE) 50 MCG/ACT nasal spray Place 1 spray into both nostrils 2 (two) times daily. 48 g 1  . meclizine (ANTIVERT) 25 MG tablet Take 1 tablet (25 mg total) by mouth 3 (three) times daily as needed for dizziness. 90 tablet 1   No current facility-administered medications on file prior to visit.     Allergies  Allergen Reactions  . Hydrocodone Itching and Nausea Only    Family History  Problem Relation Age of Onset  . Cancer  Mother        SINUS, retinal  . Parkinson's disease Mother   . Lung cancer Father   . Liver cancer Father   . Testicular cancer Brother   . Prostate cancer Brother   . Atrial fibrillation Brother   . Gout Brother   . Colon cancer Neg Hx   . Esophageal cancer Neg Hx   . Pancreatic cancer Neg Hx   . Rectal cancer Neg Hx   . Stomach cancer Neg Hx     Social History   Socioeconomic History  . Marital status: Married    Spouse name: Not on file  . Number of children: 1  . Years of education: Not on file  . Highest education level: Not on file  Occupational History    Employer: [employer]  Social Needs  . Financial resource strain: Not on file  . Food insecurity:    Worry: Not on file    Inability: Not on file  . Transportation needs:    Medical: Not on file    Non-medical: Not on file  Tobacco Use  . Smoking status: Former Smoker    Packs/day: 0.50    Years: 20.00    Pack years: 10.00    Last attempt to quit: 08/21/1988    Years since quitting: 29.9  . Smokeless tobacco: Never Used  Substance and Sexual Activity  . Alcohol use: Yes    Alcohol/week:  1.0 - 2.0 standard drinks    Types: 1 - 2 Standard drinks or equivalent per week  . Drug use: No  . Sexual activity: Yes    Partners: Male    Birth control/protection: Post-menopausal  Lifestyle  . Physical activity:    Days per week: Not on file    Minutes per session: Not on file  . Stress: Not on file  Relationships  . Social connections:    Talks on phone: Not on file    Gets together: Not on file    Attends religious service: Not on file    Active member of club or organization: Not on file    Attends meetings of clubs or organizations: Not on file    Relationship status: Not on file  Other Topics Concern  . Not on file  Social History Narrative  . Not on file   Review of Systems - See HPI.  All other ROS are negative.  BP 136/84   Pulse 100   Temp 98.9 F (37.2 C) (Oral)   Resp 16   Wt 156  lb 9.6 oz (71 kg)   LMP 08/21/1997 (Approximate)   HC 66" (167.6 cm)   SpO2 96%   BMI 25.28 kg/m   Physical Exam Vitals signs reviewed.  Constitutional:      Appearance: She is well-developed.  HENT:     Head: Normocephalic and atraumatic.     Mouth/Throat:     Mouth: Mucous membranes are moist.     Pharynx: Oropharynx is clear.  Cardiovascular:     Rate and Rhythm: Normal rate and regular rhythm.     Heart sounds: Normal heart sounds.  Pulmonary:     Effort: Pulmonary effort is normal.     Breath sounds: Normal breath sounds.  Abdominal:     General: Bowel sounds are normal. There is no distension or abdominal bruit.     Palpations: Abdomen is soft.     Tenderness: There is abdominal tenderness in the epigastric area and left upper quadrant. There is no right CVA tenderness, left CVA tenderness, guarding or rebound. Negative signs include Murphy's sign.     Hernia: No hernia is present.  Neurological:     Mental Status: She is alert.  Psychiatric:        Mood and Affect: Mood normal.    Recent Results (from the past 2160 hour(s))  Comprehensive metabolic panel     Status: None   Collection Time: 08/05/18  2:04 PM  Result Value Ref Range   Sodium 137 135 - 145 mEq/L   Potassium 3.6 3.5 - 5.1 mEq/L   Chloride 100 96 - 112 mEq/L   CO2 27 19 - 32 mEq/L   Glucose, Bld 87 70 - 99 mg/dL   BUN 9 6 - 23 mg/dL   Creatinine, Ser 0.55 0.40 - 1.20 mg/dL   Total Bilirubin 0.5 0.2 - 1.2 mg/dL   Alkaline Phosphatase 52 39 - 117 U/L   AST 15 0 - 37 U/L   ALT 10 0 - 35 U/L   Total Protein 7.3 6.0 - 8.3 g/dL   Albumin 3.9 3.5 - 5.2 g/dL   Calcium 9.7 8.4 - 10.5 mg/dL   GFR 116.32 >60.00 mL/min  CBC with Differential/Platelet     Status: Abnormal   Collection Time: 08/05/18  2:04 PM  Result Value Ref Range   WBC 7.7 4.0 - 10.5 K/uL   RBC 3.73 (L) 3.87 - 5.11 Mil/uL   Hemoglobin  12.3 12.0 - 15.0 g/dL   HCT 36.4 36.0 - 46.0 %   MCV 97.6 78.0 - 100.0 fl   MCHC 33.9 30.0 - 36.0  g/dL   RDW 12.4 11.5 - 15.5 %   Platelets 324.0 150.0 - 400.0 K/uL   Neutrophils Relative % 76.4 43.0 - 77.0 %   Lymphocytes Relative 11.0 (L) 12.0 - 46.0 %   Monocytes Relative 10.0 3.0 - 12.0 %   Eosinophils Relative 1.8 0.0 - 5.0 %   Basophils Relative 0.8 0.0 - 3.0 %   Neutro Abs 5.9 1.4 - 7.7 K/uL   Lymphs Abs 0.8 0.7 - 4.0 K/uL   Monocytes Absolute 0.8 0.1 - 1.0 K/uL   Eosinophils Absolute 0.1 0.0 - 0.7 K/uL   Basophils Absolute 0.1 0.0 - 0.1 K/uL  Stool culture     Status: None   Collection Time: 08/06/18  1:03 PM  Result Value Ref Range   MICRO NUMBER: [id number]    SPECIMEN QUALITY: Adequate    SOURCE: STOOL    STATUS: FINAL    SHIGA RESULT: Not Detected    MICRO NUMBER: [id number]    SPECIMEN QUALITY: Adequate    Source STOOL    STATUS: FINAL    CAM RESULT: No enteric Campylobacter isolated    MICRO NUMBER: [id number]    SPECIMEN QUALITY: Adequate    SOURCE: STOOL    STATUS: FINAL    SS RESULT:      Normal enteric flora not present. No Salmonella or Shigella isolated  Clostridium difficile Toxin B, Qualitative, Real-Time PCR     Status: None   Collection Time: 08/06/18  1:03 PM  Result Value Ref Range   Toxigenic C. Difficile by PCR NOT DETECTED NOT DETECT    Comment: . This test is for use only with liquid or soft stools;  performance characteristics of other clinical specimen  types have not been established. . This assay was performed by Cepheid GeneXpert(R) PCR. The performance characteristics of this assay have been determined by [employer]. Performance characteristics refer to the analytical performance of the test. . For additional information, please refer to http://education.[facility].com/faq/FAQ136 (This link is being provided for informational/educational purposes only.) .     Assessment/Plan: 1. Chronic diarrhea - CBC w/Diff - H. pylori antibody, IgG - Ambulatory referral to Gastroenterology   Initial labs and stool studies  are negative. Start daily probiotic. Increase fiber. Will check additional labs today. Referral placed to GI for colonoscopy and further assessment.  2. Gastroesophageal reflux disease without esophagitis Labs today. Start Protonix daily and Carafate as directed. Begin probiotic. Dietary recommendations reviewed with patient. Follow-up will be scheduled based on results and when we can get her scheduled with GI.  - CBC w/Diff - H. pylori antibody, IgG - Lipase - pantoprazole (PROTONIX) 40 MG tablet; Take 1 tablet (40 mg total) by mouth daily.  Dispense: 30 tablet; Refill: 0 - sucralfate (CARAFATE) 1 g tablet; Take 1 tablet (1 g total) by mouth 4 (four) times daily -  with meals and at bedtime.  Dispense: 30 tablet; Refill: 0 - Ambulatory referral to Gastroenterology   Leeanne Rio, PA-C

## 2018-08-15 ENCOUNTER — Other Ambulatory Visit: Payer: Self-pay | Admitting: Physician Assistant

## 2018-08-15 DIAGNOSIS — A048 Other specified bacterial intestinal infections: Secondary | ICD-10-CM

## 2018-08-15 LAB — STOOL CULTURE
MICRO NUMBER:: 91511488
MICRO NUMBER:: 91511489
MICRO NUMBER:: 91511491
SHIGA RESULT:: NOT DETECTED
SPECIMEN QUALITY: ADEQUATE
SPECIMEN QUALITY: ADEQUATE
SPECIMEN QUALITY:: ADEQUATE

## 2018-08-15 LAB — OVA AND PARASITE EXAMINATION
CONCENTRATE RESULT:: NONE SEEN
MICRO NUMBER:: 91511490
SPECIMEN QUALITY: ADEQUATE
TRICHROME RESULT:: NONE SEEN

## 2018-08-15 LAB — CLOSTRIDIUM DIFFICILE TOXIN B, QUALITATIVE, REAL-TIME PCR: Toxigenic C. Difficile by PCR: NOT DETECTED

## 2018-08-15 MED ORDER — CLARITHROMYCIN 500 MG PO TABS: 500.0000 mg | ORAL_TABLET | Freq: Two times a day (BID) | ORAL | 0 refills | Status: AC

## 2018-08-15 MED ORDER — METRONIDAZOLE 500 MG PO TABS: 500.0000 mg | ORAL_TABLET | Freq: Three times a day (TID) | ORAL | 0 refills | Status: DC

## 2018-08-17 ENCOUNTER — Emergency Department (HOSPITAL_COMMUNITY)
Admission: EM | Admit: 2018-08-17 | Discharge: 2018-08-17 | Discharge status: Against Medical Advice | Payer: Medicare HMO

## 2018-08-19 ENCOUNTER — Encounter: Payer: Self-pay | Admitting: Family Medicine

## 2018-08-19 ENCOUNTER — Other Ambulatory Visit: Payer: Self-pay

## 2018-08-19 ENCOUNTER — Ambulatory Visit (INDEPENDENT_AMBULATORY_CARE_PROVIDER_SITE_OTHER): Payer: Medicare HMO | Admitting: Family Medicine

## 2018-08-19 VITALS — BP 145/89 | HR 91 | Temp 98.1°F | Resp 16 | Ht 66.0 in | Wt 156.0 lb

## 2018-08-19 DIAGNOSIS — A048 Other specified bacterial intestinal infections: Secondary | ICD-10-CM

## 2018-08-19 DIAGNOSIS — Z789 Other specified health status: Secondary | ICD-10-CM

## 2018-08-19 MED ORDER — AMOXICILLIN 500 MG PO TABS: 1000.0000 mg | ORAL_TABLET | Freq: Two times a day (BID) | ORAL | 0 refills | Status: DC

## 2018-08-19 NOTE — Patient Instructions (Addendum)
Follow up as needed or as scheduled We'll call you with your GI appt STOP the Metronidazole START the Amoxicillin- 2 tabs at the same time, twice daily- take w/ food!!! CONTINUE the Clarithromycin twice daily- if you take w/ potato chips the weird metallic taste improves DRINK plenty of fluids EAT a starchy, bland diet Call with an questions or concerns Hang in there!!!

## 2018-08-19 NOTE — Progress Notes (Signed)
   Subjective:    Patient ID: Elaine Lambert, female    DOB: 12-01-1950, 67 y.o.   MRN: [id number]  HPI abd pain- pt was seen 12/24 and dx'd w/ H pylori.  'this medicine is making me so sick'- referring to Metronidazole.  Pt reports she is taking medicine w/ food.  'after an hour to hour and a half, I feel awful and I have to lie down'.  Started medication on 12/27- and that's when she started feeling bad.  Severe abdominal cramping.     Review of Systems For ROS see HPI     Objective:   Physical Exam Vitals signs reviewed.  Constitutional:      General: She is not in acute distress.    Appearance: She is well-developed. She is not ill-appearing.  HENT:     Head: Normocephalic and atraumatic.  Abdominal:     General: Bowel sounds are normal. There is no distension.     Tenderness: There is generalized abdominal tenderness (mild diffuse TTP). There is no guarding or rebound.  Skin:    General: Skin is warm and dry.  Neurological:     General: No focal deficit present.     Mental Status: She is alert and oriented to person, place, and time.  Psychiatric:        Mood and Affect: Mood is anxious.     Comments: Tearful, anxious           Assessment & Plan:

## 2018-08-20 DIAGNOSIS — A048 Other specified bacterial intestinal infections: Secondary | ICD-10-CM | POA: Insufficient documentation

## 2018-08-20 NOTE — Assessment & Plan Note (Signed)
New to provider.  Reviewed note and labs from last visit during which her dx was made and treatment started.  She is intolerant to the Flagyl and not able to complete the remainder of her abx course.  After researching alternative regimens, will switch to Amox based on information found on UTD.  Reviewed dx, tx, and supportive measures.  Reviewed red flags that should prompt return.  Pt expressed understanding and is in agreement w/ plan.

## 2018-08-26 ENCOUNTER — Other Ambulatory Visit: Payer: Self-pay | Admitting: Physician Assistant

## 2018-08-26 DIAGNOSIS — K219 Gastro-esophageal reflux disease without esophagitis: Secondary | ICD-10-CM

## 2018-08-26 NOTE — Telephone Encounter (Signed)
Ok to refill 

## 2018-09-02 ENCOUNTER — Ambulatory Visit (HOSPITAL_BASED_OUTPATIENT_CLINIC_OR_DEPARTMENT_OTHER)
Admission: RE | Admit: 2018-09-02 | Discharge: 2018-09-02 | Disposition: A | Discharge status: Home / Self Care | Payer: Medicare HMO | Source: Ambulatory Visit | Attending: Family Medicine | Admitting: Family Medicine

## 2018-09-02 ENCOUNTER — Other Ambulatory Visit: Payer: Self-pay | Admitting: Family Medicine

## 2018-09-02 DIAGNOSIS — J189 Pneumonia, unspecified organism: Secondary | ICD-10-CM | POA: Diagnosis not present

## 2018-09-02 DIAGNOSIS — J90 Pleural effusion, not elsewhere classified: Secondary | ICD-10-CM | POA: Diagnosis not present

## 2018-09-02 DIAGNOSIS — R9389 Abnormal findings on diagnostic imaging of other specified body structures: Secondary | ICD-10-CM | POA: Insufficient documentation

## 2018-09-04 ENCOUNTER — Other Ambulatory Visit: Payer: Self-pay | Admitting: Physician Assistant

## 2018-09-04 DIAGNOSIS — K219 Gastro-esophageal reflux disease without esophagitis: Secondary | ICD-10-CM

## 2018-09-06 ENCOUNTER — Ambulatory Visit
Admission: RE | Admit: 2018-09-06 | Discharge: 2018-09-06 | Disposition: A | Discharge status: Home / Self Care | Payer: Medicare HMO | Source: Ambulatory Visit | Attending: Family Medicine | Admitting: Family Medicine

## 2018-09-06 ENCOUNTER — Ambulatory Visit (HOSPITAL_BASED_OUTPATIENT_CLINIC_OR_DEPARTMENT_OTHER)
Admission: RE | Admit: 2018-09-06 | Discharge: 2018-09-06 | Disposition: A | Discharge status: Home / Self Care | Payer: Medicare HMO | Source: Ambulatory Visit | Attending: Physician Assistant | Admitting: Physician Assistant

## 2018-09-06 ENCOUNTER — Other Ambulatory Visit: Payer: Self-pay | Admitting: Physician Assistant

## 2018-09-06 DIAGNOSIS — J90 Pleural effusion, not elsewhere classified: Secondary | ICD-10-CM

## 2018-09-06 DIAGNOSIS — R188 Other ascites: Secondary | ICD-10-CM | POA: Diagnosis not present

## 2018-09-06 DIAGNOSIS — R1907 Generalized intra-abdominal and pelvic swelling, mass and lump: Secondary | ICD-10-CM

## 2018-09-06 MED ORDER — IOPAMIDOL (ISOVUE-300) INJECTION 61%
75.0000 mL | Freq: Once | INTRAVENOUS | Status: AC | PRN
  Administered 2018-09-06: 75 mL via INTRAVENOUS

## 2018-09-06 MED ORDER — IOPAMIDOL (ISOVUE-300) INJECTION 61%
100.0000 mL | Freq: Once | INTRAVENOUS | Status: AC | PRN
  Administered 2018-09-06: 100 mL via INTRAVENOUS

## 2018-09-07 ENCOUNTER — Other Ambulatory Visit: Payer: Self-pay | Admitting: Physician Assistant

## 2018-09-07 DIAGNOSIS — C562 Malignant neoplasm of left ovary: Secondary | ICD-10-CM

## 2018-09-07 DIAGNOSIS — C762 Malignant neoplasm of abdomen: Secondary | ICD-10-CM

## 2018-09-18 DIAGNOSIS — Z87891 Personal history of nicotine dependence: Secondary | ICD-10-CM | POA: Diagnosis not present

## 2018-09-18 DIAGNOSIS — Z885 Allergy status to narcotic agent status: Secondary | ICD-10-CM | POA: Diagnosis not present

## 2018-09-18 DIAGNOSIS — J9811 Atelectasis: Secondary | ICD-10-CM | POA: Diagnosis not present

## 2018-09-18 DIAGNOSIS — C562 Malignant neoplasm of left ovary: Secondary | ICD-10-CM | POA: Diagnosis not present

## 2018-09-18 DIAGNOSIS — J90 Pleural effusion, not elsewhere classified: Secondary | ICD-10-CM | POA: Diagnosis not present

## 2018-09-25 DIAGNOSIS — C561 Malignant neoplasm of right ovary: Secondary | ICD-10-CM | POA: Diagnosis not present

## 2018-09-25 DIAGNOSIS — C562 Malignant neoplasm of left ovary: Secondary | ICD-10-CM | POA: Diagnosis not present

## 2018-09-27 ENCOUNTER — Ambulatory Visit: Payer: Medicare HMO | Admitting: Gastroenterology

## 2018-09-27 DIAGNOSIS — C481 Malignant neoplasm of specified parts of peritoneum: Secondary | ICD-10-CM | POA: Diagnosis not present

## 2018-09-27 DIAGNOSIS — C562 Malignant neoplasm of left ovary: Secondary | ICD-10-CM | POA: Diagnosis not present

## 2018-09-27 DIAGNOSIS — K668 Other specified disorders of peritoneum: Secondary | ICD-10-CM | POA: Diagnosis not present

## 2018-09-27 DIAGNOSIS — C786 Secondary malignant neoplasm of retroperitoneum and peritoneum: Secondary | ICD-10-CM | POA: Diagnosis not present

## 2018-09-27 DIAGNOSIS — R188 Other ascites: Secondary | ICD-10-CM | POA: Diagnosis not present

## 2018-10-02 DIAGNOSIS — Z79899 Other long term (current) drug therapy: Secondary | ICD-10-CM | POA: Diagnosis not present

## 2018-10-02 DIAGNOSIS — C562 Malignant neoplasm of left ovary: Secondary | ICD-10-CM | POA: Diagnosis not present

## 2018-10-13 ENCOUNTER — Other Ambulatory Visit: Payer: Self-pay | Admitting: Family Medicine

## 2018-10-13 DIAGNOSIS — K219 Gastro-esophageal reflux disease without esophagitis: Secondary | ICD-10-CM

## 2018-10-21 ENCOUNTER — Encounter: Payer: Medicare HMO | Admitting: Family Medicine

## 2018-10-23 DIAGNOSIS — C562 Malignant neoplasm of left ovary: Secondary | ICD-10-CM | POA: Diagnosis not present

## 2018-10-23 DIAGNOSIS — Z79899 Other long term (current) drug therapy: Secondary | ICD-10-CM | POA: Diagnosis not present

## 2018-10-24 ENCOUNTER — Encounter: Payer: Self-pay | Admitting: Physician Assistant

## 2018-10-24 ENCOUNTER — Encounter: Payer: Medicare HMO | Admitting: Family Medicine

## 2018-10-24 ENCOUNTER — Ambulatory Visit: Payer: Medicare HMO

## 2018-10-25 ENCOUNTER — Encounter: Payer: Medicare HMO | Admitting: Family Medicine

## 2018-11-13 DIAGNOSIS — C562 Malignant neoplasm of left ovary: Secondary | ICD-10-CM | POA: Diagnosis not present

## 2018-11-13 DIAGNOSIS — Z79899 Other long term (current) drug therapy: Secondary | ICD-10-CM | POA: Diagnosis not present

## 2018-11-19 ENCOUNTER — Other Ambulatory Visit: Payer: Self-pay | Admitting: Family Medicine

## 2018-12-06 ENCOUNTER — Other Ambulatory Visit: Payer: Self-pay

## 2018-12-06 DIAGNOSIS — H6591 Unspecified nonsuppurative otitis media, right ear: Secondary | ICD-10-CM

## 2018-12-06 MED ORDER — FLUTICASONE PROPIONATE 50 MCG/ACT NA SUSP: 1.0000 | Freq: Two times a day (BID) | NASAL | 1 refills | Status: DC

## 2018-12-09 DIAGNOSIS — R188 Other ascites: Secondary | ICD-10-CM | POA: Diagnosis not present

## 2018-12-09 DIAGNOSIS — R911 Solitary pulmonary nodule: Secondary | ICD-10-CM | POA: Diagnosis not present

## 2018-12-09 DIAGNOSIS — C562 Malignant neoplasm of left ovary: Secondary | ICD-10-CM | POA: Diagnosis not present

## 2018-12-10 DIAGNOSIS — Z5111 Encounter for antineoplastic chemotherapy: Secondary | ICD-10-CM | POA: Diagnosis not present

## 2018-12-10 DIAGNOSIS — Z79899 Other long term (current) drug therapy: Secondary | ICD-10-CM | POA: Diagnosis not present

## 2018-12-10 DIAGNOSIS — C562 Malignant neoplasm of left ovary: Secondary | ICD-10-CM | POA: Diagnosis not present

## 2018-12-25 DIAGNOSIS — R188 Other ascites: Secondary | ICD-10-CM | POA: Diagnosis not present

## 2018-12-25 DIAGNOSIS — K769 Liver disease, unspecified: Secondary | ICD-10-CM | POA: Diagnosis not present

## 2018-12-25 DIAGNOSIS — C562 Malignant neoplasm of left ovary: Secondary | ICD-10-CM | POA: Diagnosis not present

## 2018-12-25 DIAGNOSIS — R911 Solitary pulmonary nodule: Secondary | ICD-10-CM | POA: Diagnosis not present

## 2019-01-01 DIAGNOSIS — R911 Solitary pulmonary nodule: Secondary | ICD-10-CM | POA: Diagnosis not present

## 2019-01-01 DIAGNOSIS — Z79899 Other long term (current) drug therapy: Secondary | ICD-10-CM | POA: Diagnosis not present

## 2019-01-01 DIAGNOSIS — C562 Malignant neoplasm of left ovary: Secondary | ICD-10-CM | POA: Diagnosis not present

## 2019-01-15 DIAGNOSIS — Z1159 Encounter for screening for other viral diseases: Secondary | ICD-10-CM | POA: Diagnosis not present

## 2019-01-20 DIAGNOSIS — Z66 Do not resuscitate: Secondary | ICD-10-CM | POA: Diagnosis not present

## 2019-01-20 DIAGNOSIS — C562 Malignant neoplasm of left ovary: Secondary | ICD-10-CM | POA: Diagnosis not present

## 2019-01-20 DIAGNOSIS — R911 Solitary pulmonary nodule: Secondary | ICD-10-CM | POA: Diagnosis not present

## 2019-01-20 DIAGNOSIS — C3412 Malignant neoplasm of upper lobe, left bronchus or lung: Secondary | ICD-10-CM | POA: Diagnosis not present

## 2019-01-22 DIAGNOSIS — C3490 Malignant neoplasm of unspecified part of unspecified bronchus or lung: Secondary | ICD-10-CM | POA: Diagnosis not present

## 2019-01-22 DIAGNOSIS — C562 Malignant neoplasm of left ovary: Secondary | ICD-10-CM | POA: Diagnosis not present

## 2019-01-22 DIAGNOSIS — Z79899 Other long term (current) drug therapy: Secondary | ICD-10-CM | POA: Diagnosis not present

## 2019-01-22 DIAGNOSIS — C3412 Malignant neoplasm of upper lobe, left bronchus or lung: Secondary | ICD-10-CM | POA: Diagnosis not present

## 2019-01-22 DIAGNOSIS — Z87891 Personal history of nicotine dependence: Secondary | ICD-10-CM | POA: Diagnosis not present

## 2019-01-23 DIAGNOSIS — C3412 Malignant neoplasm of upper lobe, left bronchus or lung: Secondary | ICD-10-CM | POA: Diagnosis not present

## 2019-01-23 DIAGNOSIS — Z87891 Personal history of nicotine dependence: Secondary | ICD-10-CM | POA: Diagnosis not present

## 2019-01-23 DIAGNOSIS — C3490 Malignant neoplasm of unspecified part of unspecified bronchus or lung: Secondary | ICD-10-CM | POA: Diagnosis not present

## 2019-01-23 DIAGNOSIS — C562 Malignant neoplasm of left ovary: Secondary | ICD-10-CM | POA: Diagnosis not present

## 2019-01-24 DIAGNOSIS — C3412 Malignant neoplasm of upper lobe, left bronchus or lung: Secondary | ICD-10-CM | POA: Diagnosis not present

## 2019-01-29 DIAGNOSIS — C569 Malignant neoplasm of unspecified ovary: Secondary | ICD-10-CM | POA: Diagnosis not present

## 2019-01-29 DIAGNOSIS — C562 Malignant neoplasm of left ovary: Secondary | ICD-10-CM | POA: Diagnosis not present

## 2019-01-29 DIAGNOSIS — C3490 Malignant neoplasm of unspecified part of unspecified bronchus or lung: Secondary | ICD-10-CM | POA: Diagnosis not present

## 2019-01-31 DIAGNOSIS — R911 Solitary pulmonary nodule: Secondary | ICD-10-CM | POA: Diagnosis not present

## 2019-01-31 DIAGNOSIS — C562 Malignant neoplasm of left ovary: Secondary | ICD-10-CM | POA: Diagnosis not present

## 2019-01-31 DIAGNOSIS — Z87891 Personal history of nicotine dependence: Secondary | ICD-10-CM | POA: Diagnosis not present

## 2019-01-31 DIAGNOSIS — R188 Other ascites: Secondary | ICD-10-CM | POA: Diagnosis not present

## 2019-01-31 DIAGNOSIS — C3412 Malignant neoplasm of upper lobe, left bronchus or lung: Secondary | ICD-10-CM | POA: Diagnosis not present

## 2019-02-12 DIAGNOSIS — E785 Hyperlipidemia, unspecified: Secondary | ICD-10-CM | POA: Diagnosis not present

## 2019-02-12 DIAGNOSIS — C562 Malignant neoplasm of left ovary: Secondary | ICD-10-CM | POA: Diagnosis not present

## 2019-02-12 DIAGNOSIS — R188 Other ascites: Secondary | ICD-10-CM | POA: Diagnosis not present

## 2019-02-12 DIAGNOSIS — I10 Essential (primary) hypertension: Secondary | ICD-10-CM | POA: Diagnosis not present

## 2019-02-12 DIAGNOSIS — C786 Secondary malignant neoplasm of retroperitoneum and peritoneum: Secondary | ICD-10-CM | POA: Diagnosis not present

## 2019-02-12 DIAGNOSIS — Z01812 Encounter for preprocedural laboratory examination: Secondary | ICD-10-CM | POA: Diagnosis not present

## 2019-02-12 DIAGNOSIS — C3490 Malignant neoplasm of unspecified part of unspecified bronchus or lung: Secondary | ICD-10-CM | POA: Diagnosis not present

## 2019-02-12 DIAGNOSIS — C569 Malignant neoplasm of unspecified ovary: Secondary | ICD-10-CM | POA: Diagnosis not present

## 2019-02-12 DIAGNOSIS — Z1159 Encounter for screening for other viral diseases: Secondary | ICD-10-CM | POA: Diagnosis not present

## 2019-02-12 DIAGNOSIS — R9431 Abnormal electrocardiogram [ECG] [EKG]: Secondary | ICD-10-CM | POA: Diagnosis not present

## 2019-02-12 DIAGNOSIS — Z01810 Encounter for preprocedural cardiovascular examination: Secondary | ICD-10-CM | POA: Diagnosis not present

## 2019-02-18 DIAGNOSIS — G8918 Other acute postprocedural pain: Secondary | ICD-10-CM | POA: Diagnosis not present

## 2019-02-18 DIAGNOSIS — C3490 Malignant neoplasm of unspecified part of unspecified bronchus or lung: Secondary | ICD-10-CM | POA: Diagnosis not present

## 2019-02-18 DIAGNOSIS — Z9221 Personal history of antineoplastic chemotherapy: Secondary | ICD-10-CM | POA: Diagnosis not present

## 2019-02-18 DIAGNOSIS — C569 Malignant neoplasm of unspecified ovary: Secondary | ICD-10-CM | POA: Diagnosis not present

## 2019-02-18 DIAGNOSIS — C7989 Secondary malignant neoplasm of other specified sites: Secondary | ICD-10-CM | POA: Diagnosis not present

## 2019-02-18 DIAGNOSIS — C562 Malignant neoplasm of left ovary: Secondary | ICD-10-CM | POA: Diagnosis not present

## 2019-02-18 DIAGNOSIS — I10 Essential (primary) hypertension: Secondary | ICD-10-CM | POA: Diagnosis not present

## 2019-02-18 DIAGNOSIS — E785 Hyperlipidemia, unspecified: Secondary | ICD-10-CM | POA: Diagnosis not present

## 2019-02-18 DIAGNOSIS — R180 Malignant ascites: Secondary | ICD-10-CM | POA: Diagnosis not present

## 2019-02-18 DIAGNOSIS — C786 Secondary malignant neoplasm of retroperitoneum and peritoneum: Secondary | ICD-10-CM | POA: Diagnosis not present

## 2019-02-18 DIAGNOSIS — R188 Other ascites: Secondary | ICD-10-CM | POA: Diagnosis not present

## 2019-03-03 DIAGNOSIS — Z79899 Other long term (current) drug therapy: Secondary | ICD-10-CM | POA: Diagnosis not present

## 2019-03-03 DIAGNOSIS — C3490 Malignant neoplasm of unspecified part of unspecified bronchus or lung: Secondary | ICD-10-CM | POA: Diagnosis not present

## 2019-03-03 DIAGNOSIS — C562 Malignant neoplasm of left ovary: Secondary | ICD-10-CM | POA: Diagnosis not present

## 2019-03-06 DIAGNOSIS — C3412 Malignant neoplasm of upper lobe, left bronchus or lung: Secondary | ICD-10-CM | POA: Diagnosis not present

## 2019-03-06 DIAGNOSIS — Z87891 Personal history of nicotine dependence: Secondary | ICD-10-CM | POA: Diagnosis not present

## 2019-03-07 DIAGNOSIS — C562 Malignant neoplasm of left ovary: Secondary | ICD-10-CM | POA: Diagnosis not present

## 2019-03-07 DIAGNOSIS — C3412 Malignant neoplasm of upper lobe, left bronchus or lung: Secondary | ICD-10-CM | POA: Diagnosis not present

## 2019-03-07 DIAGNOSIS — C3432 Malignant neoplasm of lower lobe, left bronchus or lung: Secondary | ICD-10-CM | POA: Diagnosis not present

## 2019-03-07 DIAGNOSIS — Z510 Encounter for antineoplastic radiation therapy: Secondary | ICD-10-CM | POA: Diagnosis not present

## 2019-03-10 DIAGNOSIS — C3412 Malignant neoplasm of upper lobe, left bronchus or lung: Secondary | ICD-10-CM | POA: Diagnosis not present

## 2019-03-12 ENCOUNTER — Other Ambulatory Visit: Payer: Self-pay | Admitting: Family Medicine

## 2019-03-13 DIAGNOSIS — C3412 Malignant neoplasm of upper lobe, left bronchus or lung: Secondary | ICD-10-CM | POA: Diagnosis not present

## 2019-03-13 DIAGNOSIS — C562 Malignant neoplasm of left ovary: Secondary | ICD-10-CM | POA: Diagnosis not present

## 2019-03-13 DIAGNOSIS — Z510 Encounter for antineoplastic radiation therapy: Secondary | ICD-10-CM | POA: Diagnosis not present

## 2019-03-13 DIAGNOSIS — C3432 Malignant neoplasm of lower lobe, left bronchus or lung: Secondary | ICD-10-CM | POA: Diagnosis not present

## 2019-03-14 DIAGNOSIS — C3412 Malignant neoplasm of upper lobe, left bronchus or lung: Secondary | ICD-10-CM | POA: Diagnosis not present

## 2019-03-14 DIAGNOSIS — C562 Malignant neoplasm of left ovary: Secondary | ICD-10-CM | POA: Diagnosis not present

## 2019-03-14 DIAGNOSIS — Z79899 Other long term (current) drug therapy: Secondary | ICD-10-CM | POA: Diagnosis not present

## 2019-03-25 DIAGNOSIS — C562 Malignant neoplasm of left ovary: Secondary | ICD-10-CM | POA: Diagnosis not present

## 2019-03-25 DIAGNOSIS — Z510 Encounter for antineoplastic radiation therapy: Secondary | ICD-10-CM | POA: Diagnosis not present

## 2019-03-25 DIAGNOSIS — C3412 Malignant neoplasm of upper lobe, left bronchus or lung: Secondary | ICD-10-CM | POA: Diagnosis not present

## 2019-03-25 DIAGNOSIS — C3432 Malignant neoplasm of lower lobe, left bronchus or lung: Secondary | ICD-10-CM | POA: Diagnosis not present

## 2019-03-26 DIAGNOSIS — C562 Malignant neoplasm of left ovary: Secondary | ICD-10-CM | POA: Diagnosis not present

## 2019-03-26 DIAGNOSIS — Z510 Encounter for antineoplastic radiation therapy: Secondary | ICD-10-CM | POA: Diagnosis not present

## 2019-03-26 DIAGNOSIS — C3432 Malignant neoplasm of lower lobe, left bronchus or lung: Secondary | ICD-10-CM | POA: Diagnosis not present

## 2019-03-27 DIAGNOSIS — C3432 Malignant neoplasm of lower lobe, left bronchus or lung: Secondary | ICD-10-CM | POA: Diagnosis not present

## 2019-03-27 DIAGNOSIS — Z510 Encounter for antineoplastic radiation therapy: Secondary | ICD-10-CM | POA: Diagnosis not present

## 2019-03-27 DIAGNOSIS — C562 Malignant neoplasm of left ovary: Secondary | ICD-10-CM | POA: Diagnosis not present

## 2019-03-28 DIAGNOSIS — C3432 Malignant neoplasm of lower lobe, left bronchus or lung: Secondary | ICD-10-CM | POA: Diagnosis not present

## 2019-03-28 DIAGNOSIS — C562 Malignant neoplasm of left ovary: Secondary | ICD-10-CM | POA: Diagnosis not present

## 2019-03-28 DIAGNOSIS — Z510 Encounter for antineoplastic radiation therapy: Secondary | ICD-10-CM | POA: Diagnosis not present

## 2019-03-31 DIAGNOSIS — C562 Malignant neoplasm of left ovary: Secondary | ICD-10-CM | POA: Diagnosis not present

## 2019-03-31 DIAGNOSIS — Z510 Encounter for antineoplastic radiation therapy: Secondary | ICD-10-CM | POA: Diagnosis not present

## 2019-03-31 DIAGNOSIS — C3432 Malignant neoplasm of lower lobe, left bronchus or lung: Secondary | ICD-10-CM | POA: Diagnosis not present

## 2019-04-07 DIAGNOSIS — Z7189 Other specified counseling: Secondary | ICD-10-CM | POA: Diagnosis not present

## 2019-04-07 DIAGNOSIS — Z5111 Encounter for antineoplastic chemotherapy: Secondary | ICD-10-CM | POA: Diagnosis not present

## 2019-04-07 DIAGNOSIS — Z87891 Personal history of nicotine dependence: Secondary | ICD-10-CM | POA: Diagnosis not present

## 2019-04-07 DIAGNOSIS — C562 Malignant neoplasm of left ovary: Secondary | ICD-10-CM | POA: Diagnosis not present

## 2019-04-07 DIAGNOSIS — C3412 Malignant neoplasm of upper lobe, left bronchus or lung: Secondary | ICD-10-CM | POA: Diagnosis not present

## 2019-04-13 ENCOUNTER — Other Ambulatory Visit: Payer: Self-pay | Admitting: Family Medicine

## 2019-04-13 DIAGNOSIS — K219 Gastro-esophageal reflux disease without esophagitis: Secondary | ICD-10-CM

## 2019-05-05 DIAGNOSIS — C3490 Malignant neoplasm of unspecified part of unspecified bronchus or lung: Secondary | ICD-10-CM | POA: Diagnosis not present

## 2019-05-05 DIAGNOSIS — Z87891 Personal history of nicotine dependence: Secondary | ICD-10-CM | POA: Diagnosis not present

## 2019-05-05 DIAGNOSIS — Z5111 Encounter for antineoplastic chemotherapy: Secondary | ICD-10-CM | POA: Diagnosis not present

## 2019-05-05 DIAGNOSIS — C3412 Malignant neoplasm of upper lobe, left bronchus or lung: Secondary | ICD-10-CM | POA: Diagnosis not present

## 2019-05-05 DIAGNOSIS — C562 Malignant neoplasm of left ovary: Secondary | ICD-10-CM | POA: Diagnosis not present

## 2019-05-27 DIAGNOSIS — C562 Malignant neoplasm of left ovary: Secondary | ICD-10-CM | POA: Diagnosis not present

## 2019-05-28 DIAGNOSIS — C569 Malignant neoplasm of unspecified ovary: Secondary | ICD-10-CM | POA: Diagnosis not present

## 2019-05-28 DIAGNOSIS — R188 Other ascites: Secondary | ICD-10-CM | POA: Diagnosis not present

## 2019-06-02 DIAGNOSIS — C562 Malignant neoplasm of left ovary: Secondary | ICD-10-CM | POA: Diagnosis not present

## 2019-06-02 DIAGNOSIS — Z7901 Long term (current) use of anticoagulants: Secondary | ICD-10-CM | POA: Diagnosis not present

## 2019-06-02 DIAGNOSIS — C3412 Malignant neoplasm of upper lobe, left bronchus or lung: Secondary | ICD-10-CM | POA: Diagnosis not present

## 2019-06-02 DIAGNOSIS — Z1501 Genetic susceptibility to malignant neoplasm of breast: Secondary | ICD-10-CM | POA: Diagnosis not present

## 2019-06-02 DIAGNOSIS — Z79899 Other long term (current) drug therapy: Secondary | ICD-10-CM | POA: Diagnosis not present

## 2019-06-02 DIAGNOSIS — Z9889 Other specified postprocedural states: Secondary | ICD-10-CM | POA: Diagnosis not present

## 2019-06-02 DIAGNOSIS — Z923 Personal history of irradiation: Secondary | ICD-10-CM | POA: Diagnosis not present

## 2019-06-05 ENCOUNTER — Other Ambulatory Visit: Payer: Self-pay | Admitting: Family Medicine

## 2019-06-05 DIAGNOSIS — H6591 Unspecified nonsuppurative otitis media, right ear: Secondary | ICD-10-CM

## 2019-06-09 DIAGNOSIS — Z5111 Encounter for antineoplastic chemotherapy: Secondary | ICD-10-CM | POA: Diagnosis not present

## 2019-06-09 DIAGNOSIS — T451X5A Adverse effect of antineoplastic and immunosuppressive drugs, initial encounter: Secondary | ICD-10-CM | POA: Diagnosis not present

## 2019-06-09 DIAGNOSIS — R110 Nausea: Secondary | ICD-10-CM | POA: Diagnosis not present

## 2019-06-09 DIAGNOSIS — C3412 Malignant neoplasm of upper lobe, left bronchus or lung: Secondary | ICD-10-CM | POA: Diagnosis not present

## 2019-06-09 DIAGNOSIS — Z79899 Other long term (current) drug therapy: Secondary | ICD-10-CM | POA: Diagnosis not present

## 2019-06-09 DIAGNOSIS — C562 Malignant neoplasm of left ovary: Secondary | ICD-10-CM | POA: Diagnosis not present

## 2019-06-09 DIAGNOSIS — Z923 Personal history of irradiation: Secondary | ICD-10-CM | POA: Diagnosis not present

## 2019-06-09 DIAGNOSIS — Z9889 Other specified postprocedural states: Secondary | ICD-10-CM | POA: Diagnosis not present

## 2019-06-09 DIAGNOSIS — Z87891 Personal history of nicotine dependence: Secondary | ICD-10-CM | POA: Diagnosis not present

## 2019-06-20 DIAGNOSIS — R188 Other ascites: Secondary | ICD-10-CM | POA: Diagnosis not present

## 2019-06-20 DIAGNOSIS — C562 Malignant neoplasm of left ovary: Secondary | ICD-10-CM | POA: Diagnosis not present

## 2019-06-20 DIAGNOSIS — R5383 Other fatigue: Secondary | ICD-10-CM | POA: Diagnosis not present

## 2019-06-20 DIAGNOSIS — I2699 Other pulmonary embolism without acute cor pulmonale: Secondary | ICD-10-CM | POA: Diagnosis not present

## 2019-06-20 DIAGNOSIS — K7689 Other specified diseases of liver: Secondary | ICD-10-CM | POA: Diagnosis not present

## 2019-06-20 DIAGNOSIS — Z79899 Other long term (current) drug therapy: Secondary | ICD-10-CM | POA: Diagnosis not present

## 2019-06-20 DIAGNOSIS — C569 Malignant neoplasm of unspecified ovary: Secondary | ICD-10-CM | POA: Diagnosis not present

## 2019-06-20 DIAGNOSIS — Z23 Encounter for immunization: Secondary | ICD-10-CM | POA: Diagnosis not present

## 2019-06-20 DIAGNOSIS — Z87891 Personal history of nicotine dependence: Secondary | ICD-10-CM | POA: Diagnosis not present

## 2019-06-20 DIAGNOSIS — Z86711 Personal history of pulmonary embolism: Secondary | ICD-10-CM | POA: Diagnosis not present

## 2019-06-20 DIAGNOSIS — R918 Other nonspecific abnormal finding of lung field: Secondary | ICD-10-CM | POA: Diagnosis not present

## 2019-06-20 DIAGNOSIS — C786 Secondary malignant neoplasm of retroperitoneum and peritoneum: Secondary | ICD-10-CM | POA: Diagnosis not present

## 2019-06-23 DIAGNOSIS — Z86711 Personal history of pulmonary embolism: Secondary | ICD-10-CM | POA: Diagnosis not present

## 2019-06-23 DIAGNOSIS — C3412 Malignant neoplasm of upper lobe, left bronchus or lung: Secondary | ICD-10-CM | POA: Diagnosis not present

## 2019-06-23 DIAGNOSIS — Z923 Personal history of irradiation: Secondary | ICD-10-CM | POA: Diagnosis not present

## 2019-06-23 DIAGNOSIS — Z9889 Other specified postprocedural states: Secondary | ICD-10-CM | POA: Diagnosis not present

## 2019-06-23 DIAGNOSIS — G893 Neoplasm related pain (acute) (chronic): Secondary | ICD-10-CM | POA: Diagnosis not present

## 2019-06-23 DIAGNOSIS — Z79899 Other long term (current) drug therapy: Secondary | ICD-10-CM | POA: Diagnosis not present

## 2019-06-23 DIAGNOSIS — Z87891 Personal history of nicotine dependence: Secondary | ICD-10-CM | POA: Diagnosis not present

## 2019-06-23 DIAGNOSIS — C562 Malignant neoplasm of left ovary: Secondary | ICD-10-CM | POA: Diagnosis not present

## 2019-06-23 DIAGNOSIS — Z23 Encounter for immunization: Secondary | ICD-10-CM | POA: Diagnosis not present

## 2019-06-24 ENCOUNTER — Telehealth: Payer: Self-pay | Admitting: Family Medicine

## 2019-06-24 DIAGNOSIS — K219 Gastro-esophageal reflux disease without esophagitis: Secondary | ICD-10-CM

## 2019-06-24 DIAGNOSIS — H6591 Unspecified nonsuppurative otitis media, right ear: Secondary | ICD-10-CM

## 2019-06-24 MED ORDER — MECLIZINE HCL 25 MG PO TABS: 25.0000 mg | ORAL_TABLET | Freq: Three times a day (TID) | ORAL | 1 refills | Status: AC | PRN

## 2019-06-24 MED ORDER — FLUTICASONE PROPIONATE 50 MCG/ACT NA SUSP: 1.0000 | Freq: Two times a day (BID) | NASAL | 1 refills | Status: AC

## 2019-06-24 MED ORDER — ATORVASTATIN CALCIUM 20 MG PO TABS: 20.0000 mg | ORAL_TABLET | Freq: Every day | ORAL | 1 refills | Status: AC

## 2019-06-24 MED ORDER — PANTOPRAZOLE SODIUM 40 MG PO TBEC: 40.0000 mg | DELAYED_RELEASE_TABLET | Freq: Every day | ORAL | 1 refills | Status: AC

## 2019-06-24 NOTE — Telephone Encounter (Signed)
Medication filled to pharmacy as requested.  Pt made aware.

## 2019-06-24 NOTE — Telephone Encounter (Signed)
Patient called saying that she wanted to transfer her medication that Dr. Birdie Riddle has prescribedto a different pharmacy. [facility] [address] in [place]. Patient did say that she has two rx [facility] at [facility] but request for them to be sent to the [facility] listed above. Please advise.

## 2019-06-30 DIAGNOSIS — R911 Solitary pulmonary nodule: Secondary | ICD-10-CM | POA: Diagnosis not present

## 2019-06-30 DIAGNOSIS — Z9889 Other specified postprocedural states: Secondary | ICD-10-CM | POA: Diagnosis not present

## 2019-06-30 DIAGNOSIS — C3412 Malignant neoplasm of upper lobe, left bronchus or lung: Secondary | ICD-10-CM | POA: Diagnosis not present

## 2019-06-30 DIAGNOSIS — C562 Malignant neoplasm of left ovary: Secondary | ICD-10-CM | POA: Diagnosis not present

## 2019-06-30 DIAGNOSIS — Z79899 Other long term (current) drug therapy: Secondary | ICD-10-CM | POA: Diagnosis not present

## 2019-06-30 DIAGNOSIS — Z86711 Personal history of pulmonary embolism: Secondary | ICD-10-CM | POA: Diagnosis not present

## 2019-06-30 DIAGNOSIS — Z7901 Long term (current) use of anticoagulants: Secondary | ICD-10-CM | POA: Diagnosis not present

## 2019-06-30 DIAGNOSIS — C3490 Malignant neoplasm of unspecified part of unspecified bronchus or lung: Secondary | ICD-10-CM | POA: Diagnosis not present

## 2019-06-30 DIAGNOSIS — Z87891 Personal history of nicotine dependence: Secondary | ICD-10-CM | POA: Diagnosis not present

## 2019-06-30 DIAGNOSIS — Z923 Personal history of irradiation: Secondary | ICD-10-CM | POA: Diagnosis not present

## 2019-07-04 DIAGNOSIS — C562 Malignant neoplasm of left ovary: Secondary | ICD-10-CM | POA: Diagnosis not present

## 2019-07-04 DIAGNOSIS — Z20828 Contact with and (suspected) exposure to other viral communicable diseases: Secondary | ICD-10-CM | POA: Diagnosis not present

## 2019-07-04 DIAGNOSIS — Z1159 Encounter for screening for other viral diseases: Secondary | ICD-10-CM | POA: Diagnosis not present

## 2019-07-04 DIAGNOSIS — Z01812 Encounter for preprocedural laboratory examination: Secondary | ICD-10-CM | POA: Diagnosis not present

## 2019-07-04 DIAGNOSIS — C3412 Malignant neoplasm of upper lobe, left bronchus or lung: Secondary | ICD-10-CM | POA: Diagnosis not present

## 2019-07-08 DIAGNOSIS — Z79899 Other long term (current) drug therapy: Secondary | ICD-10-CM | POA: Diagnosis not present

## 2019-07-08 DIAGNOSIS — C3490 Malignant neoplasm of unspecified part of unspecified bronchus or lung: Secondary | ICD-10-CM | POA: Diagnosis not present

## 2019-07-08 DIAGNOSIS — R0600 Dyspnea, unspecified: Secondary | ICD-10-CM | POA: Diagnosis not present

## 2019-07-08 DIAGNOSIS — R110 Nausea: Secondary | ICD-10-CM | POA: Diagnosis not present

## 2019-07-08 DIAGNOSIS — C562 Malignant neoplasm of left ovary: Secondary | ICD-10-CM | POA: Diagnosis not present

## 2019-07-08 DIAGNOSIS — R630 Anorexia: Secondary | ICD-10-CM | POA: Diagnosis not present

## 2019-07-08 DIAGNOSIS — Z515 Encounter for palliative care: Secondary | ICD-10-CM | POA: Diagnosis not present

## 2019-07-08 DIAGNOSIS — R432 Parageusia: Secondary | ICD-10-CM | POA: Diagnosis not present

## 2019-07-08 DIAGNOSIS — C3412 Malignant neoplasm of upper lobe, left bronchus or lung: Secondary | ICD-10-CM | POA: Diagnosis not present

## 2019-07-08 DIAGNOSIS — Z87891 Personal history of nicotine dependence: Secondary | ICD-10-CM | POA: Diagnosis not present

## 2019-07-08 DIAGNOSIS — R0609 Other forms of dyspnea: Secondary | ICD-10-CM | POA: Diagnosis not present

## 2019-07-08 DIAGNOSIS — Z6824 Body mass index (BMI) 24.0-24.9, adult: Secondary | ICD-10-CM | POA: Diagnosis not present

## 2019-07-10 DIAGNOSIS — Z7901 Long term (current) use of anticoagulants: Secondary | ICD-10-CM | POA: Diagnosis not present

## 2019-07-10 DIAGNOSIS — C7960 Secondary malignant neoplasm of unspecified ovary: Secondary | ICD-10-CM | POA: Diagnosis not present

## 2019-07-10 DIAGNOSIS — I2699 Other pulmonary embolism without acute cor pulmonale: Secondary | ICD-10-CM | POA: Diagnosis not present

## 2019-07-10 DIAGNOSIS — R918 Other nonspecific abnormal finding of lung field: Secondary | ICD-10-CM | POA: Diagnosis not present

## 2019-07-10 DIAGNOSIS — C3412 Malignant neoplasm of upper lobe, left bronchus or lung: Secondary | ICD-10-CM | POA: Diagnosis not present

## 2019-07-10 DIAGNOSIS — C562 Malignant neoplasm of left ovary: Secondary | ICD-10-CM | POA: Diagnosis not present

## 2019-07-10 DIAGNOSIS — R530 Neoplastic (malignant) related fatigue: Secondary | ICD-10-CM | POA: Diagnosis not present

## 2019-07-11 DIAGNOSIS — R590 Localized enlarged lymph nodes: Secondary | ICD-10-CM | POA: Diagnosis not present

## 2019-07-11 DIAGNOSIS — Z86711 Personal history of pulmonary embolism: Secondary | ICD-10-CM | POA: Diagnosis not present

## 2019-07-11 DIAGNOSIS — Z87891 Personal history of nicotine dependence: Secondary | ICD-10-CM | POA: Diagnosis not present

## 2019-07-11 DIAGNOSIS — Z85118 Personal history of other malignant neoplasm of bronchus and lung: Secondary | ICD-10-CM | POA: Diagnosis not present

## 2019-07-11 DIAGNOSIS — Z9889 Other specified postprocedural states: Secondary | ICD-10-CM | POA: Diagnosis not present

## 2019-07-11 DIAGNOSIS — C3412 Malignant neoplasm of upper lobe, left bronchus or lung: Secondary | ICD-10-CM | POA: Diagnosis not present

## 2019-07-11 DIAGNOSIS — Z7901 Long term (current) use of anticoagulants: Secondary | ICD-10-CM | POA: Diagnosis not present

## 2019-07-11 DIAGNOSIS — Z8543 Personal history of malignant neoplasm of ovary: Secondary | ICD-10-CM | POA: Diagnosis not present

## 2019-07-11 DIAGNOSIS — Z9221 Personal history of antineoplastic chemotherapy: Secondary | ICD-10-CM | POA: Diagnosis not present

## 2019-07-11 DIAGNOSIS — Z923 Personal history of irradiation: Secondary | ICD-10-CM | POA: Diagnosis not present

## 2019-07-11 DIAGNOSIS — R918 Other nonspecific abnormal finding of lung field: Secondary | ICD-10-CM | POA: Diagnosis not present

## 2019-07-14 DIAGNOSIS — Z923 Personal history of irradiation: Secondary | ICD-10-CM | POA: Diagnosis not present

## 2019-07-14 DIAGNOSIS — Z7901 Long term (current) use of anticoagulants: Secondary | ICD-10-CM | POA: Diagnosis not present

## 2019-07-14 DIAGNOSIS — Z79899 Other long term (current) drug therapy: Secondary | ICD-10-CM | POA: Diagnosis not present

## 2019-07-14 DIAGNOSIS — Z87891 Personal history of nicotine dependence: Secondary | ICD-10-CM | POA: Diagnosis not present

## 2019-07-14 DIAGNOSIS — C562 Malignant neoplasm of left ovary: Secondary | ICD-10-CM | POA: Diagnosis not present

## 2019-07-14 DIAGNOSIS — Z9889 Other specified postprocedural states: Secondary | ICD-10-CM | POA: Diagnosis not present

## 2019-07-14 DIAGNOSIS — I2699 Other pulmonary embolism without acute cor pulmonale: Secondary | ICD-10-CM | POA: Diagnosis not present

## 2019-07-14 DIAGNOSIS — C3412 Malignant neoplasm of upper lobe, left bronchus or lung: Secondary | ICD-10-CM | POA: Diagnosis not present

## 2019-07-21 DIAGNOSIS — R531 Weakness: Secondary | ICD-10-CM | POA: Diagnosis not present

## 2019-07-21 DIAGNOSIS — G629 Polyneuropathy, unspecified: Secondary | ICD-10-CM | POA: Diagnosis not present

## 2019-07-21 DIAGNOSIS — R5383 Other fatigue: Secondary | ICD-10-CM | POA: Diagnosis not present

## 2019-07-21 DIAGNOSIS — C3412 Malignant neoplasm of upper lobe, left bronchus or lung: Secondary | ICD-10-CM | POA: Diagnosis not present

## 2019-07-21 DIAGNOSIS — C562 Malignant neoplasm of left ovary: Secondary | ICD-10-CM | POA: Diagnosis not present

## 2019-07-21 DIAGNOSIS — Z5111 Encounter for antineoplastic chemotherapy: Secondary | ICD-10-CM | POA: Diagnosis not present

## 2019-07-21 DIAGNOSIS — Z20828 Contact with and (suspected) exposure to other viral communicable diseases: Secondary | ICD-10-CM | POA: Diagnosis not present

## 2019-07-21 DIAGNOSIS — R197 Diarrhea, unspecified: Secondary | ICD-10-CM | POA: Diagnosis not present

## 2019-07-21 DIAGNOSIS — R0602 Shortness of breath: Secondary | ICD-10-CM | POA: Diagnosis not present

## 2019-07-21 DIAGNOSIS — R110 Nausea: Secondary | ICD-10-CM | POA: Diagnosis not present

## 2019-07-22 DIAGNOSIS — Z8543 Personal history of malignant neoplasm of ovary: Secondary | ICD-10-CM | POA: Diagnosis not present

## 2019-07-22 DIAGNOSIS — R590 Localized enlarged lymph nodes: Secondary | ICD-10-CM | POA: Diagnosis not present

## 2019-07-22 DIAGNOSIS — C771 Secondary and unspecified malignant neoplasm of intrathoracic lymph nodes: Secondary | ICD-10-CM | POA: Diagnosis not present

## 2019-07-22 DIAGNOSIS — Z85118 Personal history of other malignant neoplasm of bronchus and lung: Secondary | ICD-10-CM | POA: Diagnosis not present

## 2019-07-22 DIAGNOSIS — C969 Malignant neoplasm of lymphoid, hematopoietic and related tissue, unspecified: Secondary | ICD-10-CM | POA: Diagnosis not present

## 2019-07-22 DIAGNOSIS — C781 Secondary malignant neoplasm of mediastinum: Secondary | ICD-10-CM | POA: Diagnosis not present

## 2019-07-22 DIAGNOSIS — C7802 Secondary malignant neoplasm of left lung: Secondary | ICD-10-CM | POA: Diagnosis not present

## 2019-07-22 DIAGNOSIS — C3412 Malignant neoplasm of upper lobe, left bronchus or lung: Secondary | ICD-10-CM | POA: Diagnosis not present

## 2019-07-23 DIAGNOSIS — I313 Pericardial effusion (noninflammatory): Secondary | ICD-10-CM | POA: Diagnosis not present

## 2019-07-23 DIAGNOSIS — R188 Other ascites: Secondary | ICD-10-CM | POA: Diagnosis not present

## 2019-07-23 DIAGNOSIS — C3412 Malignant neoplasm of upper lobe, left bronchus or lung: Secondary | ICD-10-CM | POA: Diagnosis not present

## 2019-07-24 DIAGNOSIS — Z7901 Long term (current) use of anticoagulants: Secondary | ICD-10-CM | POA: Diagnosis not present

## 2019-07-24 DIAGNOSIS — C562 Malignant neoplasm of left ovary: Secondary | ICD-10-CM | POA: Diagnosis not present

## 2019-07-24 DIAGNOSIS — C3412 Malignant neoplasm of upper lobe, left bronchus or lung: Secondary | ICD-10-CM | POA: Diagnosis not present

## 2019-07-28 DIAGNOSIS — C3412 Malignant neoplasm of upper lobe, left bronchus or lung: Secondary | ICD-10-CM | POA: Diagnosis not present

## 2019-07-28 DIAGNOSIS — C562 Malignant neoplasm of left ovary: Secondary | ICD-10-CM | POA: Diagnosis not present

## 2019-07-28 DIAGNOSIS — Z7901 Long term (current) use of anticoagulants: Secondary | ICD-10-CM | POA: Diagnosis not present

## 2019-07-28 DIAGNOSIS — Z923 Personal history of irradiation: Secondary | ICD-10-CM | POA: Diagnosis not present

## 2019-07-28 DIAGNOSIS — I2699 Other pulmonary embolism without acute cor pulmonale: Secondary | ICD-10-CM | POA: Diagnosis not present

## 2019-08-04 DIAGNOSIS — Z86711 Personal history of pulmonary embolism: Secondary | ICD-10-CM | POA: Diagnosis not present

## 2019-08-04 DIAGNOSIS — Z87891 Personal history of nicotine dependence: Secondary | ICD-10-CM | POA: Diagnosis not present

## 2019-08-04 DIAGNOSIS — Z923 Personal history of irradiation: Secondary | ICD-10-CM | POA: Diagnosis not present

## 2019-08-04 DIAGNOSIS — E876 Hypokalemia: Secondary | ICD-10-CM | POA: Diagnosis not present

## 2019-08-04 DIAGNOSIS — Z9889 Other specified postprocedural states: Secondary | ICD-10-CM | POA: Diagnosis not present

## 2019-08-04 DIAGNOSIS — C3412 Malignant neoplasm of upper lobe, left bronchus or lung: Secondary | ICD-10-CM | POA: Diagnosis not present

## 2019-08-04 DIAGNOSIS — C562 Malignant neoplasm of left ovary: Secondary | ICD-10-CM | POA: Diagnosis not present

## 2019-08-04 DIAGNOSIS — Z7901 Long term (current) use of anticoagulants: Secondary | ICD-10-CM | POA: Diagnosis not present

## 2019-08-11 DIAGNOSIS — Z9889 Other specified postprocedural states: Secondary | ICD-10-CM | POA: Diagnosis not present

## 2019-08-11 DIAGNOSIS — Z923 Personal history of irradiation: Secondary | ICD-10-CM | POA: Diagnosis not present

## 2019-08-11 DIAGNOSIS — C3412 Malignant neoplasm of upper lobe, left bronchus or lung: Secondary | ICD-10-CM | POA: Diagnosis not present

## 2019-08-11 DIAGNOSIS — Z7901 Long term (current) use of anticoagulants: Secondary | ICD-10-CM | POA: Diagnosis not present

## 2019-08-11 DIAGNOSIS — Z5111 Encounter for antineoplastic chemotherapy: Secondary | ICD-10-CM | POA: Diagnosis not present

## 2019-08-11 DIAGNOSIS — C562 Malignant neoplasm of left ovary: Secondary | ICD-10-CM | POA: Diagnosis not present

## 2019-08-11 DIAGNOSIS — E876 Hypokalemia: Secondary | ICD-10-CM | POA: Diagnosis not present

## 2019-08-11 DIAGNOSIS — Z79899 Other long term (current) drug therapy: Secondary | ICD-10-CM | POA: Diagnosis not present

## 2019-08-11 DIAGNOSIS — I2699 Other pulmonary embolism without acute cor pulmonale: Secondary | ICD-10-CM | POA: Diagnosis not present

## 2019-08-11 DIAGNOSIS — Z87891 Personal history of nicotine dependence: Secondary | ICD-10-CM | POA: Diagnosis not present

## 2019-08-18 ENCOUNTER — Other Ambulatory Visit: Payer: Self-pay

## 2019-08-25 DIAGNOSIS — Z515 Encounter for palliative care: Secondary | ICD-10-CM | POA: Diagnosis not present

## 2019-08-25 DIAGNOSIS — R110 Nausea: Secondary | ICD-10-CM | POA: Diagnosis not present

## 2019-08-25 DIAGNOSIS — R251 Tremor, unspecified: Secondary | ICD-10-CM | POA: Diagnosis not present

## 2019-08-25 DIAGNOSIS — C562 Malignant neoplasm of left ovary: Secondary | ICD-10-CM | POA: Diagnosis not present

## 2019-08-25 DIAGNOSIS — R0600 Dyspnea, unspecified: Secondary | ICD-10-CM | POA: Diagnosis not present

## 2019-08-25 DIAGNOSIS — C3490 Malignant neoplasm of unspecified part of unspecified bronchus or lung: Secondary | ICD-10-CM | POA: Diagnosis not present

## 2019-08-25 DIAGNOSIS — Z5111 Encounter for antineoplastic chemotherapy: Secondary | ICD-10-CM | POA: Diagnosis not present

## 2019-08-25 DIAGNOSIS — C7802 Secondary malignant neoplasm of left lung: Secondary | ICD-10-CM | POA: Diagnosis not present

## 2019-08-25 DIAGNOSIS — E876 Hypokalemia: Secondary | ICD-10-CM | POA: Diagnosis not present

## 2019-08-25 DIAGNOSIS — R188 Other ascites: Secondary | ICD-10-CM | POA: Diagnosis not present

## 2019-08-25 DIAGNOSIS — Z86711 Personal history of pulmonary embolism: Secondary | ICD-10-CM | POA: Diagnosis not present

## 2019-08-25 DIAGNOSIS — C569 Malignant neoplasm of unspecified ovary: Secondary | ICD-10-CM | POA: Diagnosis not present

## 2019-08-25 DIAGNOSIS — Z7901 Long term (current) use of anticoagulants: Secondary | ICD-10-CM | POA: Diagnosis not present

## 2019-08-28 DIAGNOSIS — R180 Malignant ascites: Secondary | ICD-10-CM | POA: Diagnosis not present

## 2019-08-28 DIAGNOSIS — C562 Malignant neoplasm of left ovary: Secondary | ICD-10-CM | POA: Diagnosis not present

## 2019-09-01 DIAGNOSIS — I2699 Other pulmonary embolism without acute cor pulmonale: Secondary | ICD-10-CM | POA: Diagnosis not present

## 2019-09-01 DIAGNOSIS — Z5111 Encounter for antineoplastic chemotherapy: Secondary | ICD-10-CM | POA: Diagnosis not present

## 2019-09-01 DIAGNOSIS — E876 Hypokalemia: Secondary | ICD-10-CM | POA: Diagnosis not present

## 2019-09-01 DIAGNOSIS — C562 Malignant neoplasm of left ovary: Secondary | ICD-10-CM | POA: Diagnosis not present

## 2019-09-01 DIAGNOSIS — Z79899 Other long term (current) drug therapy: Secondary | ICD-10-CM | POA: Diagnosis not present

## 2019-09-01 DIAGNOSIS — G629 Polyneuropathy, unspecified: Secondary | ICD-10-CM | POA: Diagnosis not present

## 2019-09-01 DIAGNOSIS — Z7901 Long term (current) use of anticoagulants: Secondary | ICD-10-CM | POA: Diagnosis not present

## 2019-09-01 DIAGNOSIS — C3412 Malignant neoplasm of upper lobe, left bronchus or lung: Secondary | ICD-10-CM | POA: Diagnosis not present

## 2019-09-15 DIAGNOSIS — C562 Malignant neoplasm of left ovary: Secondary | ICD-10-CM | POA: Diagnosis not present

## 2019-09-15 DIAGNOSIS — C3412 Malignant neoplasm of upper lobe, left bronchus or lung: Secondary | ICD-10-CM | POA: Diagnosis not present

## 2019-09-15 DIAGNOSIS — Z87891 Personal history of nicotine dependence: Secondary | ICD-10-CM | POA: Diagnosis not present

## 2019-09-15 DIAGNOSIS — Z5111 Encounter for antineoplastic chemotherapy: Secondary | ICD-10-CM | POA: Diagnosis not present

## 2019-09-15 DIAGNOSIS — R590 Localized enlarged lymph nodes: Secondary | ICD-10-CM | POA: Diagnosis not present

## 2019-09-15 DIAGNOSIS — Z79899 Other long term (current) drug therapy: Secondary | ICD-10-CM | POA: Diagnosis not present

## 2019-09-15 DIAGNOSIS — R188 Other ascites: Secondary | ICD-10-CM | POA: Diagnosis not present

## 2019-09-22 DIAGNOSIS — R0609 Other forms of dyspnea: Secondary | ICD-10-CM | POA: Diagnosis not present

## 2019-09-22 DIAGNOSIS — Z85118 Personal history of other malignant neoplasm of bronchus and lung: Secondary | ICD-10-CM | POA: Diagnosis not present

## 2019-09-22 DIAGNOSIS — R188 Other ascites: Secondary | ICD-10-CM | POA: Diagnosis not present

## 2019-09-22 DIAGNOSIS — I2699 Other pulmonary embolism without acute cor pulmonale: Secondary | ICD-10-CM | POA: Diagnosis not present

## 2019-09-22 DIAGNOSIS — Z9889 Other specified postprocedural states: Secondary | ICD-10-CM | POA: Diagnosis not present

## 2019-09-22 DIAGNOSIS — R180 Malignant ascites: Secondary | ICD-10-CM | POA: Diagnosis not present

## 2019-09-22 DIAGNOSIS — Z7901 Long term (current) use of anticoagulants: Secondary | ICD-10-CM | POA: Diagnosis not present

## 2019-09-22 DIAGNOSIS — Z923 Personal history of irradiation: Secondary | ICD-10-CM | POA: Diagnosis not present

## 2019-09-22 DIAGNOSIS — C562 Malignant neoplasm of left ovary: Secondary | ICD-10-CM | POA: Diagnosis not present

## 2019-09-22 DIAGNOSIS — Z515 Encounter for palliative care: Secondary | ICD-10-CM | POA: Diagnosis not present

## 2019-09-22 DIAGNOSIS — R110 Nausea: Secondary | ICD-10-CM | POA: Diagnosis not present

## 2019-10-06 DIAGNOSIS — R0602 Shortness of breath: Secondary | ICD-10-CM | POA: Diagnosis not present

## 2019-10-06 DIAGNOSIS — I2699 Other pulmonary embolism without acute cor pulmonale: Secondary | ICD-10-CM | POA: Diagnosis not present

## 2019-10-06 DIAGNOSIS — Z7901 Long term (current) use of anticoagulants: Secondary | ICD-10-CM | POA: Diagnosis not present

## 2019-10-06 DIAGNOSIS — R918 Other nonspecific abnormal finding of lung field: Secondary | ICD-10-CM | POA: Diagnosis not present

## 2019-10-06 DIAGNOSIS — Z923 Personal history of irradiation: Secondary | ICD-10-CM | POA: Diagnosis not present

## 2019-10-06 DIAGNOSIS — C3490 Malignant neoplasm of unspecified part of unspecified bronchus or lung: Secondary | ICD-10-CM | POA: Diagnosis not present

## 2019-10-06 DIAGNOSIS — C3412 Malignant neoplasm of upper lobe, left bronchus or lung: Secondary | ICD-10-CM | POA: Diagnosis not present

## 2019-10-06 DIAGNOSIS — Z5111 Encounter for antineoplastic chemotherapy: Secondary | ICD-10-CM | POA: Diagnosis not present

## 2019-10-06 DIAGNOSIS — E876 Hypokalemia: Secondary | ICD-10-CM | POA: Diagnosis not present

## 2019-10-06 DIAGNOSIS — C562 Malignant neoplasm of left ovary: Secondary | ICD-10-CM | POA: Diagnosis not present

## 2019-10-08 DIAGNOSIS — C562 Malignant neoplasm of left ovary: Secondary | ICD-10-CM | POA: Diagnosis not present

## 2019-10-10 DIAGNOSIS — R188 Other ascites: Secondary | ICD-10-CM | POA: Diagnosis not present

## 2019-10-10 DIAGNOSIS — C562 Malignant neoplasm of left ovary: Secondary | ICD-10-CM | POA: Diagnosis not present

## 2019-10-13 DIAGNOSIS — C779 Secondary and unspecified malignant neoplasm of lymph node, unspecified: Secondary | ICD-10-CM | POA: Diagnosis not present

## 2019-10-13 DIAGNOSIS — Z7189 Other specified counseling: Secondary | ICD-10-CM | POA: Diagnosis not present

## 2019-10-13 DIAGNOSIS — Z66 Do not resuscitate: Secondary | ICD-10-CM | POA: Diagnosis not present

## 2019-10-13 DIAGNOSIS — Z515 Encounter for palliative care: Secondary | ICD-10-CM | POA: Diagnosis not present

## 2019-10-13 DIAGNOSIS — K56609 Unspecified intestinal obstruction, unspecified as to partial versus complete obstruction: Secondary | ICD-10-CM | POA: Diagnosis not present

## 2019-10-13 DIAGNOSIS — R000 Tachycardia, unspecified: Secondary | ICD-10-CM | POA: Diagnosis not present

## 2019-10-13 DIAGNOSIS — R180 Malignant ascites: Secondary | ICD-10-CM | POA: Diagnosis not present

## 2019-10-13 DIAGNOSIS — G893 Neoplasm related pain (acute) (chronic): Secondary | ICD-10-CM | POA: Diagnosis not present

## 2019-10-13 DIAGNOSIS — C482 Malignant neoplasm of peritoneum, unspecified: Secondary | ICD-10-CM | POA: Diagnosis not present

## 2019-10-13 DIAGNOSIS — Z4659 Encounter for fitting and adjustment of other gastrointestinal appliance and device: Secondary | ICD-10-CM | POA: Diagnosis not present

## 2019-10-13 DIAGNOSIS — E860 Dehydration: Secondary | ICD-10-CM | POA: Diagnosis not present

## 2019-10-13 DIAGNOSIS — R188 Other ascites: Secondary | ICD-10-CM | POA: Diagnosis not present

## 2019-10-13 DIAGNOSIS — C562 Malignant neoplasm of left ovary: Secondary | ICD-10-CM | POA: Diagnosis not present

## 2019-10-13 DIAGNOSIS — C3412 Malignant neoplasm of upper lobe, left bronchus or lung: Secondary | ICD-10-CM | POA: Diagnosis not present

## 2019-10-13 DIAGNOSIS — K5900 Constipation, unspecified: Secondary | ICD-10-CM | POA: Diagnosis not present

## 2019-10-13 DIAGNOSIS — C569 Malignant neoplasm of unspecified ovary: Secondary | ICD-10-CM | POA: Diagnosis not present

## 2019-10-13 DIAGNOSIS — R0602 Shortness of breath: Secondary | ICD-10-CM | POA: Diagnosis not present

## 2019-10-13 DIAGNOSIS — R112 Nausea with vomiting, unspecified: Secondary | ICD-10-CM | POA: Diagnosis not present

## 2019-10-13 DIAGNOSIS — C7800 Secondary malignant neoplasm of unspecified lung: Secondary | ICD-10-CM | POA: Diagnosis not present

## 2019-10-13 DIAGNOSIS — N179 Acute kidney failure, unspecified: Secondary | ICD-10-CM | POA: Diagnosis not present

## 2019-10-13 DIAGNOSIS — C771 Secondary and unspecified malignant neoplasm of intrathoracic lymph nodes: Secondary | ICD-10-CM | POA: Diagnosis not present

## 2019-10-13 DIAGNOSIS — K56699 Other intestinal obstruction unspecified as to partial versus complete obstruction: Secondary | ICD-10-CM | POA: Diagnosis not present

## 2019-10-20 DEATH — deceased

## 2019-11-11 ENCOUNTER — Encounter: Payer: Self-pay | Admitting: Certified Nurse Midwife

## 2019-11-28 ENCOUNTER — Other Ambulatory Visit: Payer: Self-pay | Admitting: Family Medicine

## 2019-11-28 DIAGNOSIS — Z1231 Encounter for screening mammogram for malignant neoplasm of breast: Secondary | ICD-10-CM

## 2020-07-16 IMAGING — CR DG CHEST 2V
2 series · 2 of 2 positions shown · non-contrast
Comparison: 07/22/2018.

CLINICAL DATA: History of pneumonia.

EXAM:
CHEST - 2 VIEW

[w chest pa]
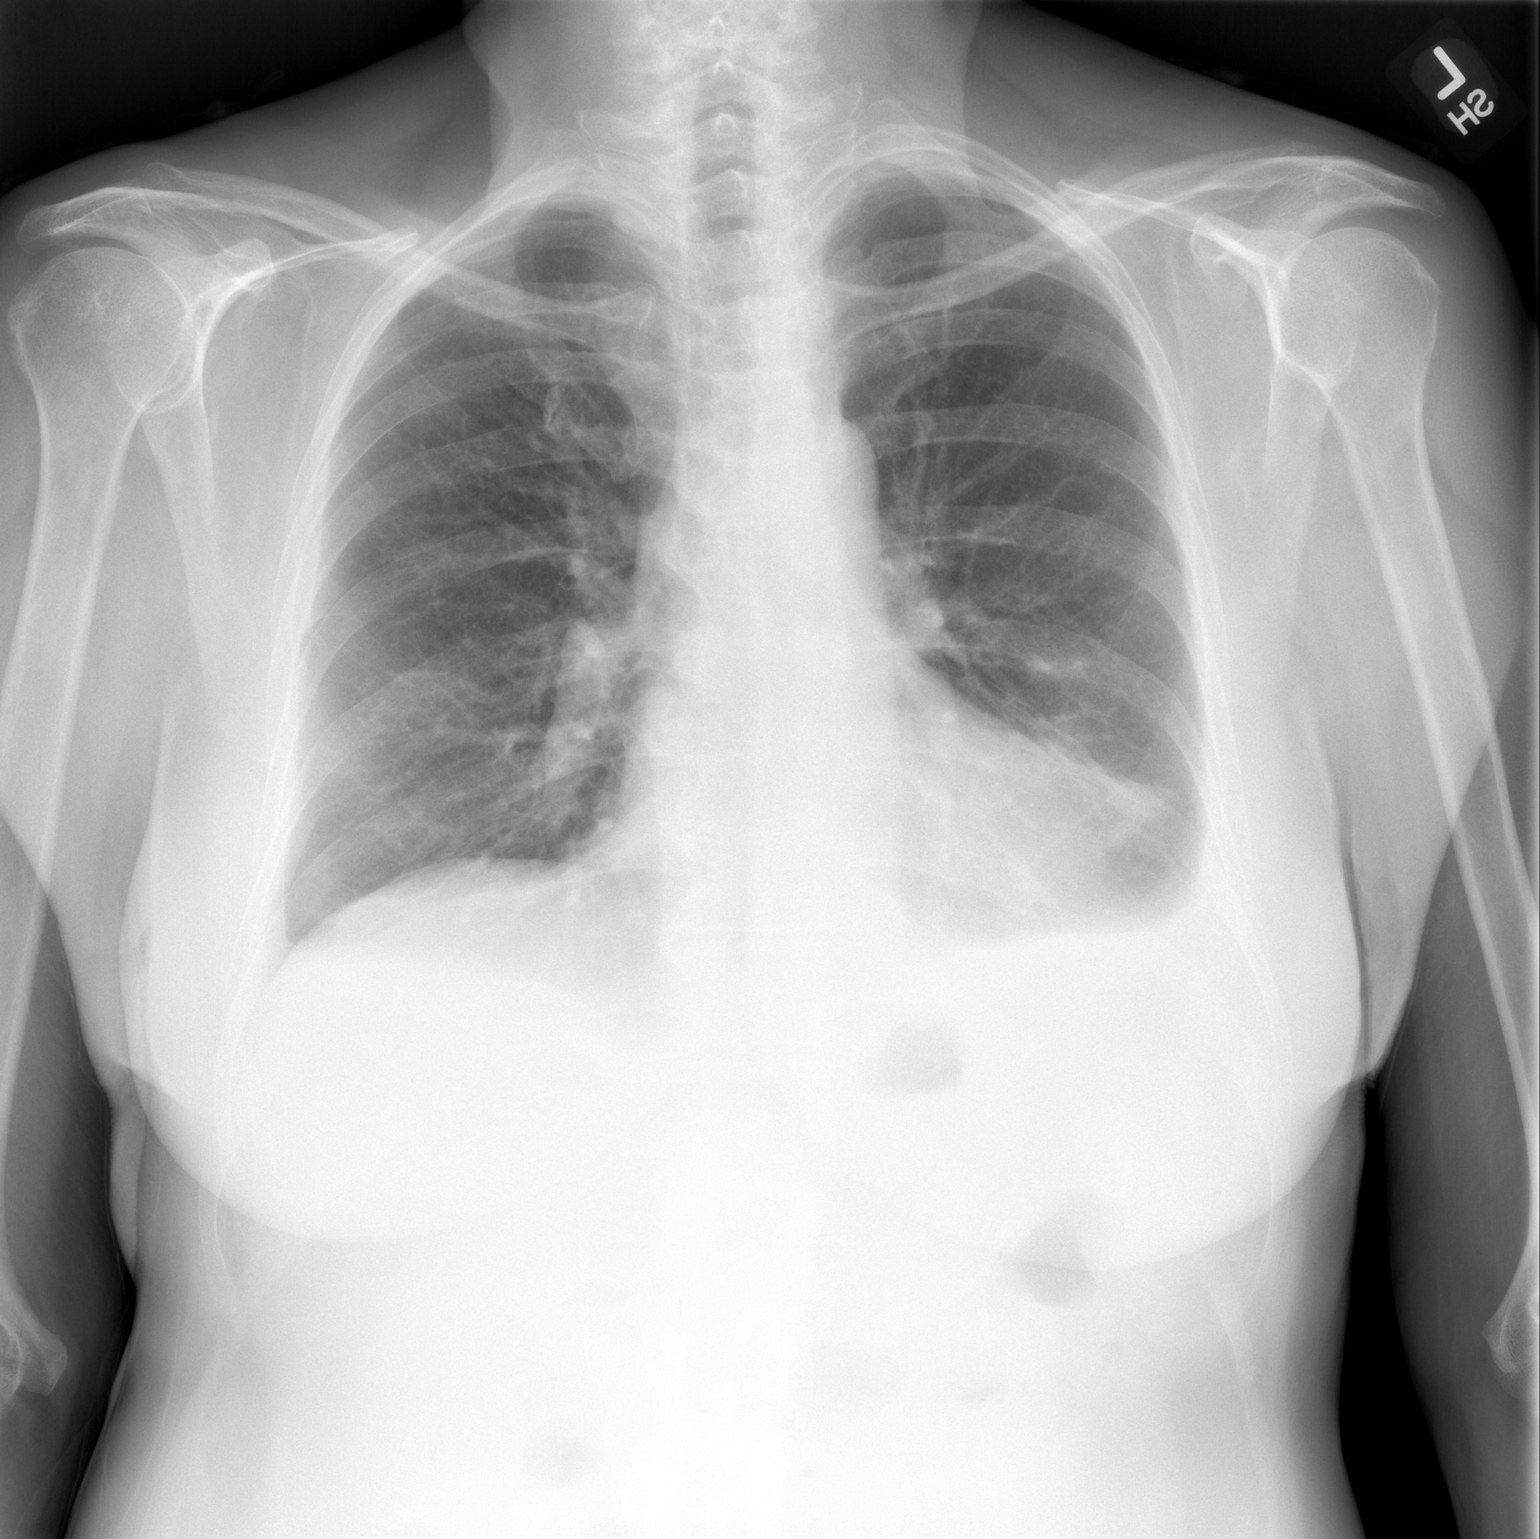

[w chest lat]
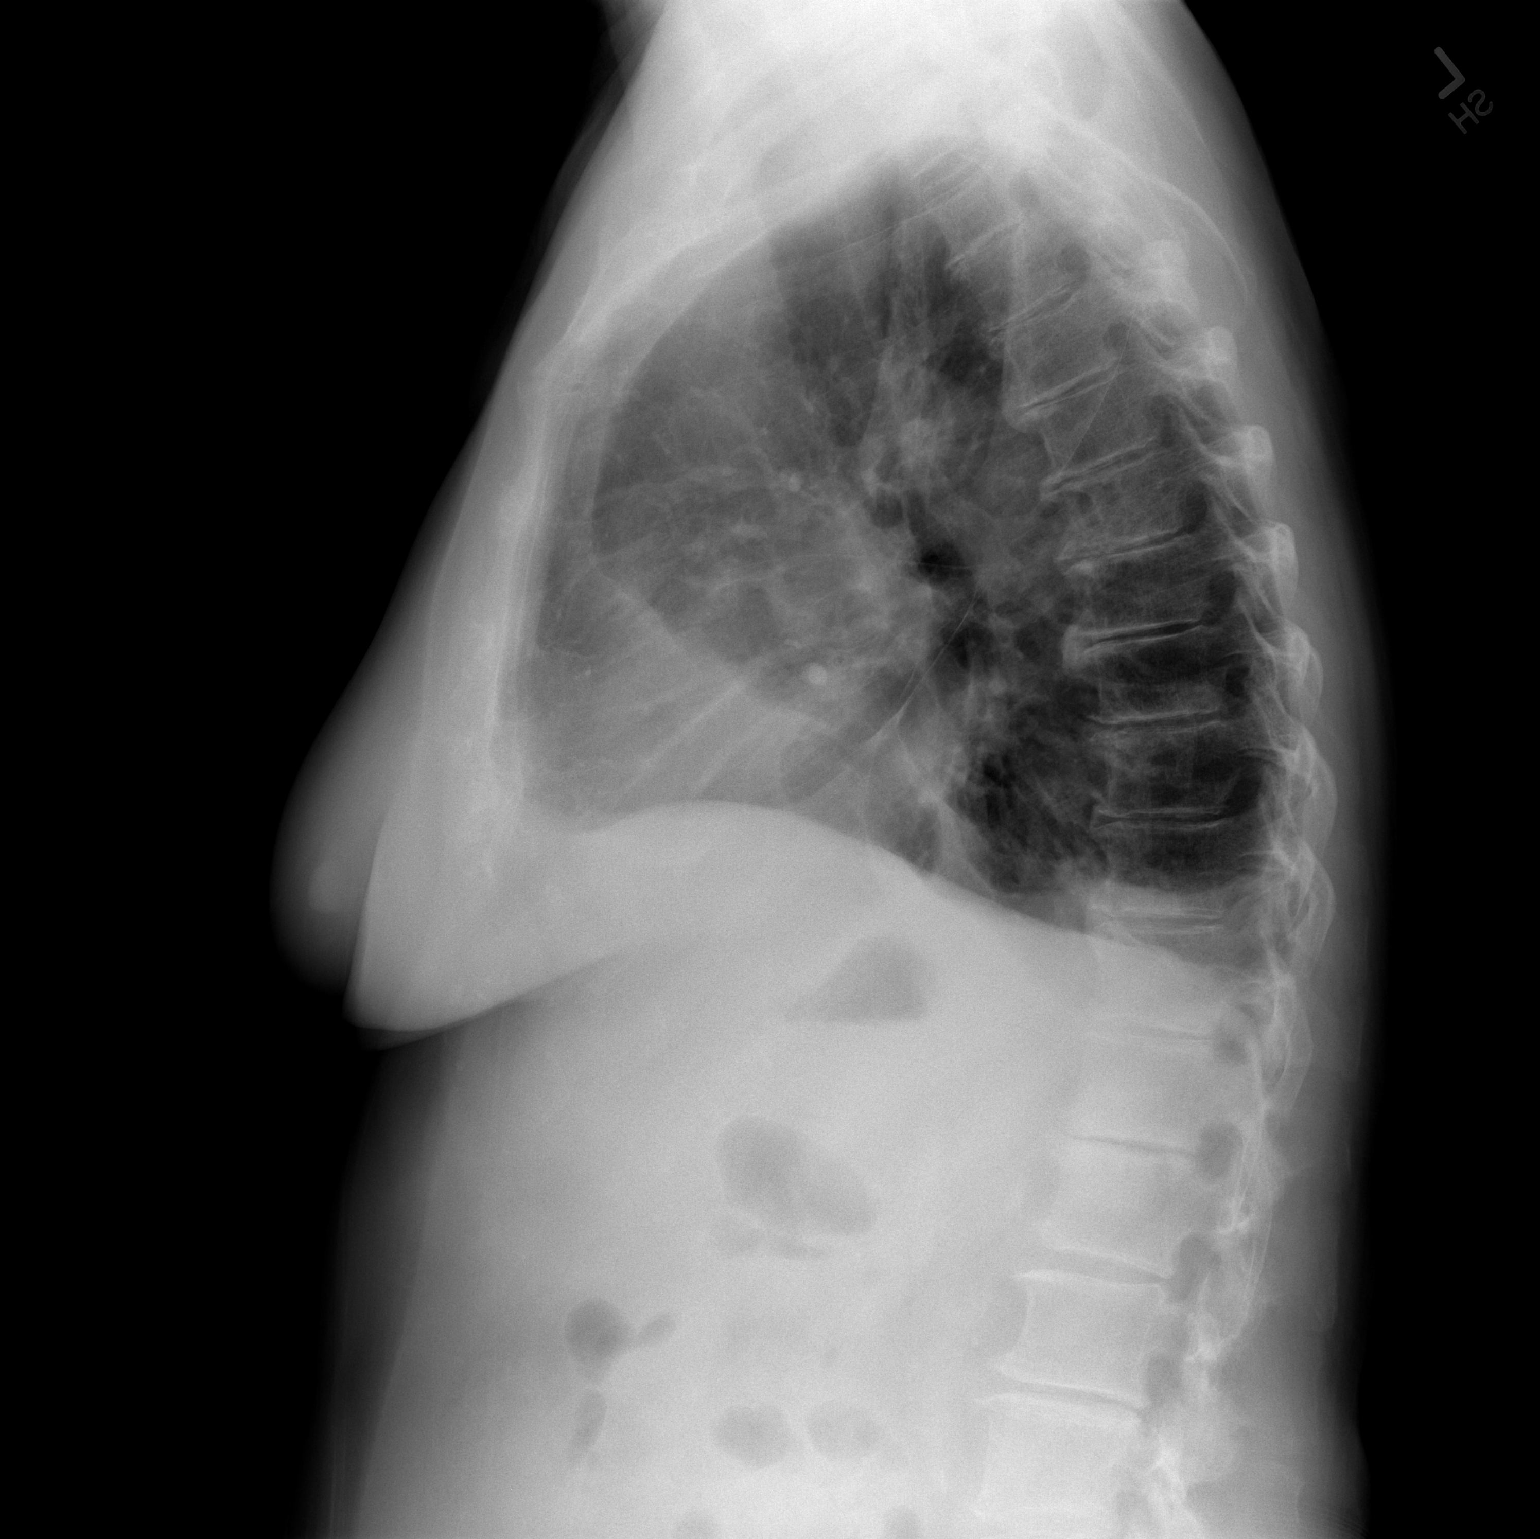

[2 of 2 positions shown; findings below may reference images not displayed]

FINDINGS: Mediastinum and hilar structures normal. Heart size normal.
Persistent density left mid lung. New atelectasis and or infiltrate
left lung base. New left pleural effusion. No pneumothorax.
IMPRESSION: 1. Persistent density left mid lung. Given lack of interval clearing
contrast-enhanced CT of the chest should be considered to further
evaluate.

2. New onset left base atelectasis/infiltrate and left-sided pleural
effusion.

## 2020-07-20 IMAGING — CT CT ABD-PELV W/ CM
2 of 5 series · 15 of 46 positions shown, 17 images · IV contrast (APPLIED)
Comparison: Chest CT dated 09/06/2018

CLINICAL DATA: 67-year-old female with generalized abdominal
swelling

EXAM:
CT ABDOMEN AND PELVIS WITH CONTRAST
TECHNIQUE: Multidetector CT imaging of the abdomen and pelvis was performed
using the standard protocol following bolus administration of
intravenous contrast.
CONTRAST:  100mL RIOWP6-200 IOPAMIDOL (RIOWP6-200) INJECTION 61%

[Series 2: axial st · axial · 0.78mm/px · z∈[-502,-76]mm · 12 of 95 slices shown, 14 images]
[im 5/95  soft-tissue]
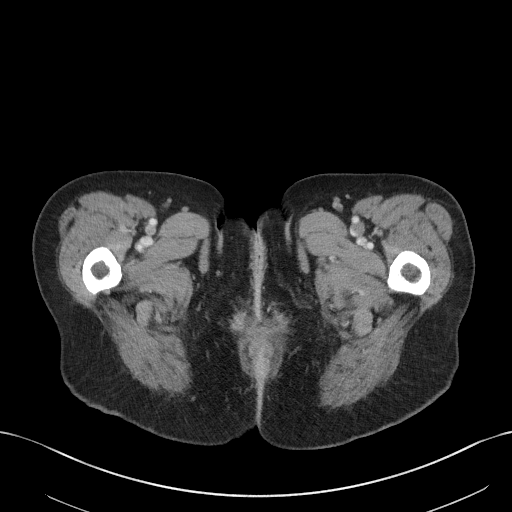
[im 5/95  bone]
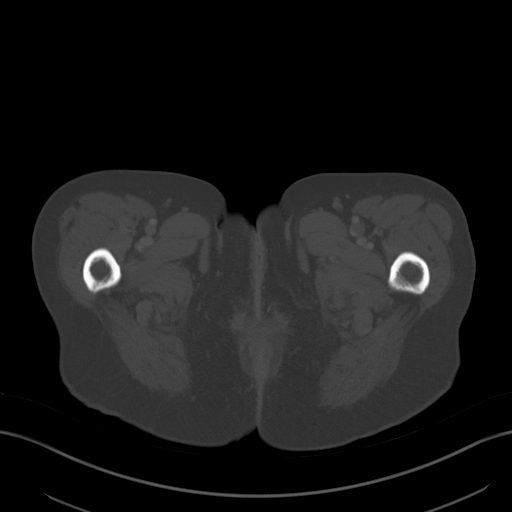
[im 15/95  soft-tissue]
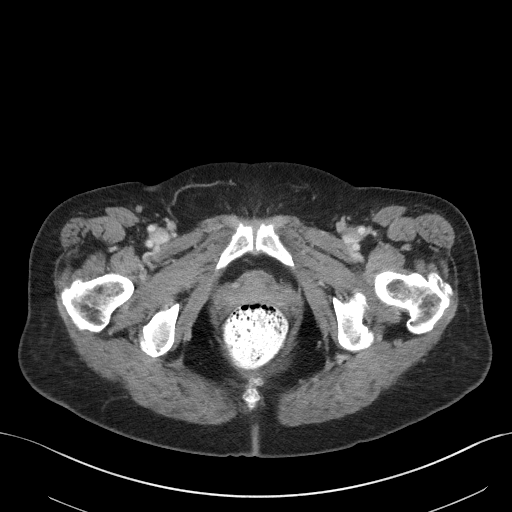
[im 19/95  soft-tissue]
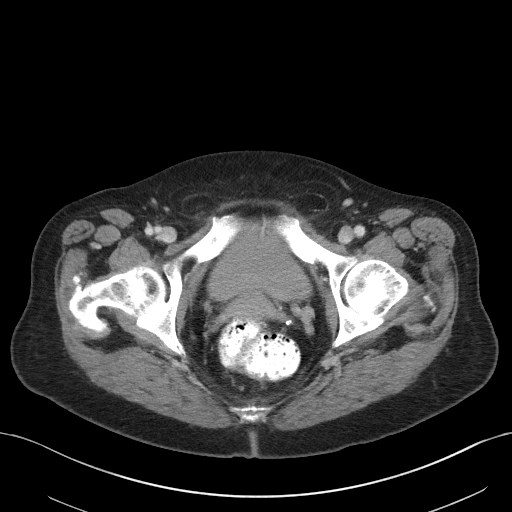
[im 29/95  soft-tissue]
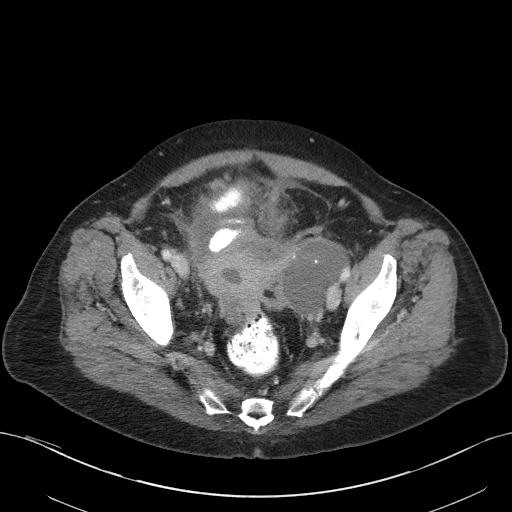
[im 38/95  soft-tissue]
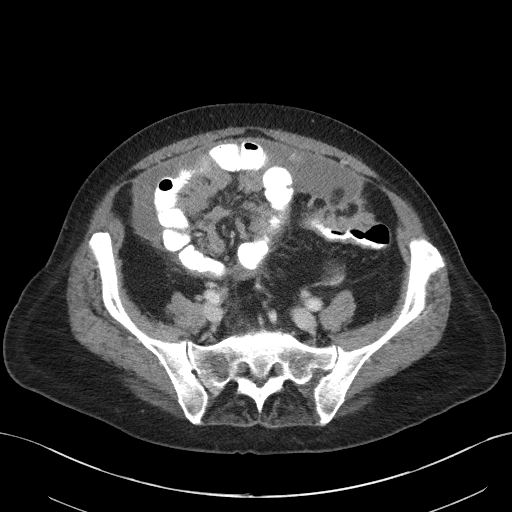
[im 43/95  soft-tissue]
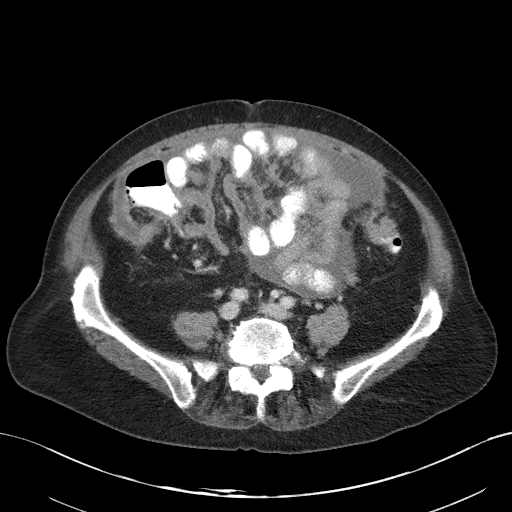
[im 52/95  soft-tissue]
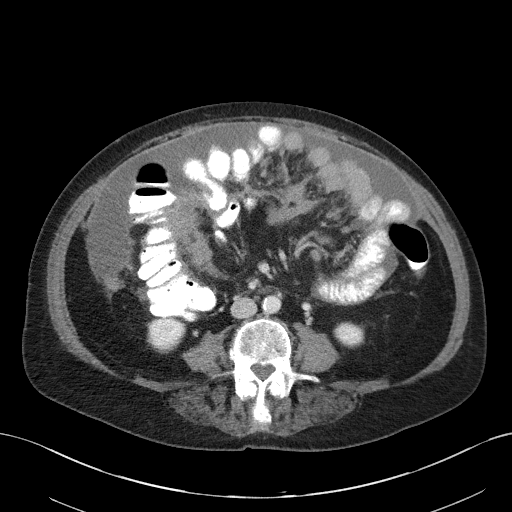
[im 57/95  soft-tissue]
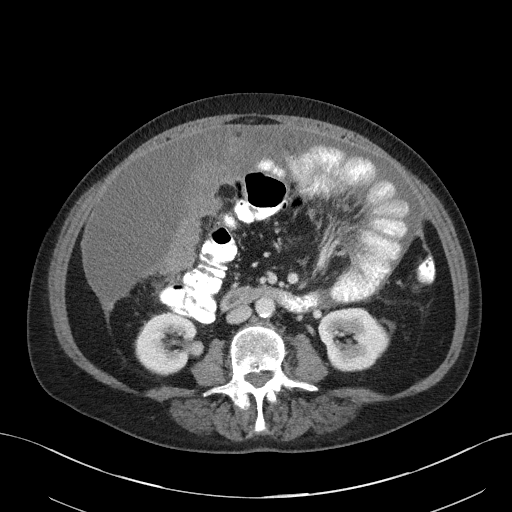
[im 66/95  soft-tissue]
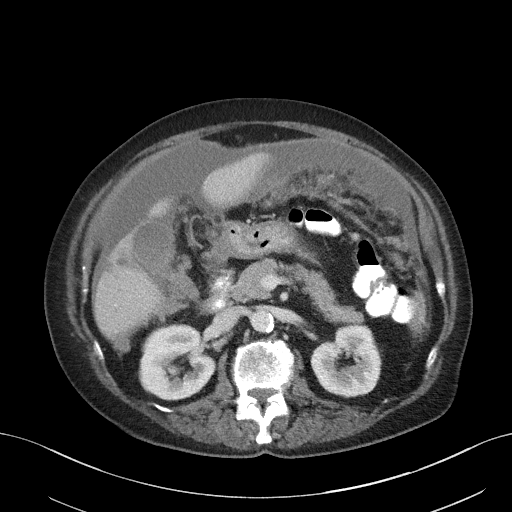
[im 66/95  bone]
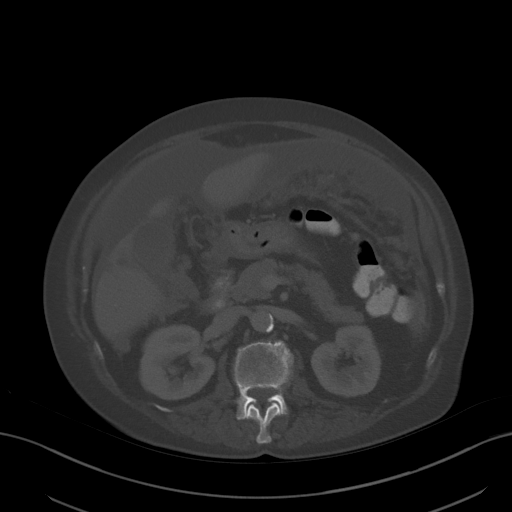
[im 76/95  soft-tissue]
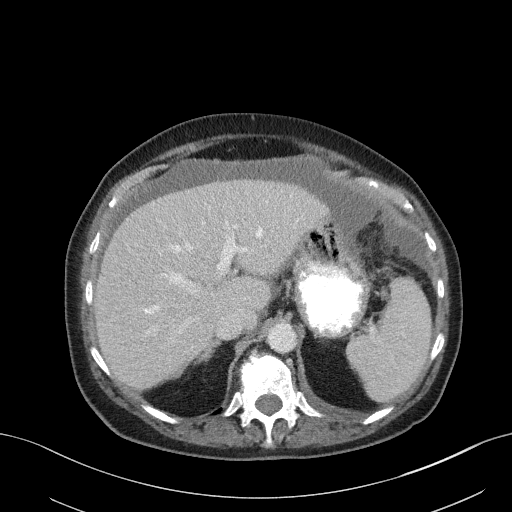
[im 80/95  soft-tissue]
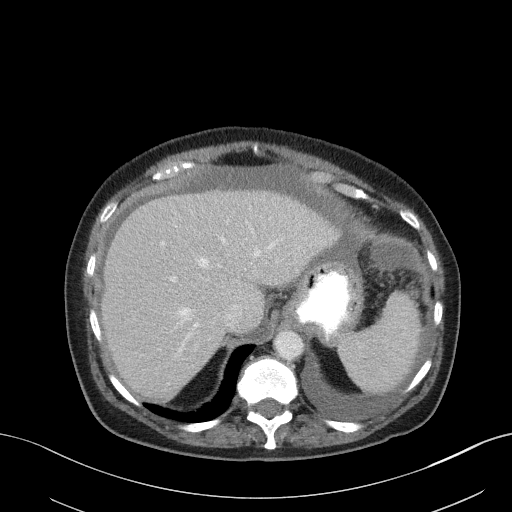
[im 90/95  soft-tissue]
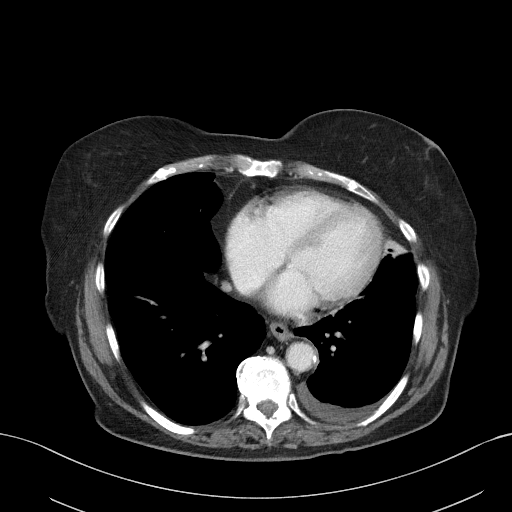

[Series 5: coronal st · coronal · 0.74mm/px · 3 of 93 slices shown]
[im 31/93  soft-tissue]
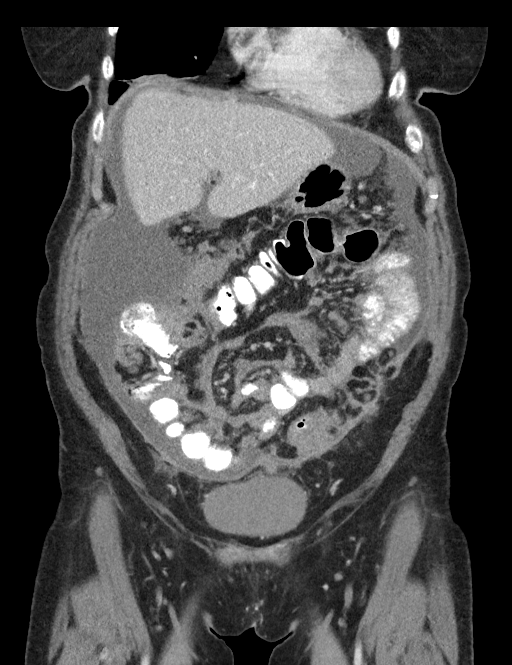
[im 41/93  soft-tissue]
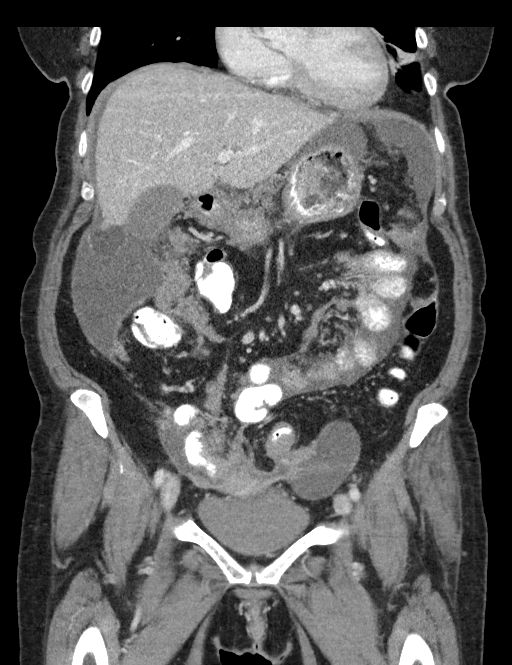
[im 52/93  soft-tissue]
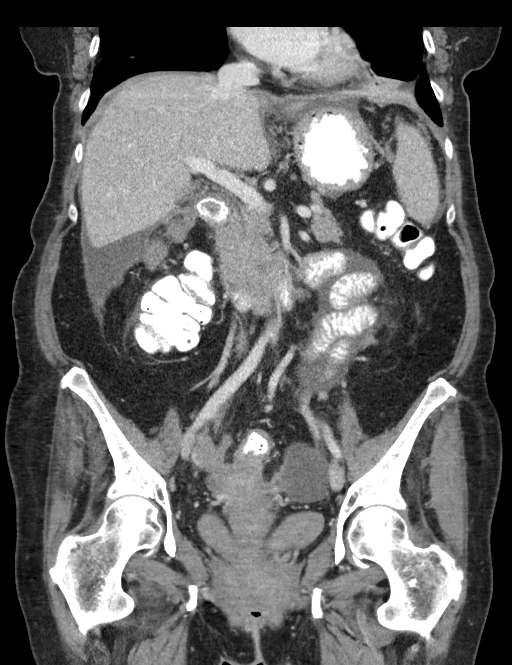

[15 of 46 positions shown; findings below may reference images not displayed]

FINDINGS: Lower chest: Partially visualized small left pleural effusion and
associated left lung base atelectasis. Please refer to the CT of the
chest report.

Hepatobiliary: Subcentimeter hypodense lesion in the left lobe of
the liver (segment II) is too small to characterize but likely a
cyst. There is a 3.1 x 3.0 cm cystic lesion arising from the
inferior surface of the right lobe of the liver (series 2 image 32).
This is not well characterized but may represent an exophytic cyst
although a cystic neoplasm or implant is not entirely excluded. No
intrahepatic biliary ductal dilatation. Probable sludge or hyper
concentrated bile within the gallbladder. No calcified stone.

Pancreas: Unremarkable. No pancreatic ductal dilatation or
surrounding inflammatory changes.

Spleen: Normal in size without focal abnormality.

Adrenals/Urinary Tract: The adrenal glands are unremarkable. There
is no hydronephrosis on either side. The visualized ureters appear
unremarkable.

Stomach/Bowel: Oral contrast opacifies the stomach and multiple
loops of small bowel and traverses into the colon. There is no
evidence of bowel obstruction. The appendix is not well visualized.

Vascular/Lymphatic: Mild aortoiliac atherosclerotic disease. No
portal venous gas. No retroperitoneal adenopathy.

Reproductive: There is a 4.4 x 6.5 cm multiseptated and complex
cystic mass in the region of the left adnexa. There is a complex
soft tissue density posterior to the uterus abutting the posterior
aspect of the uterus and anterior wall of the rectosigmoid. The
uterus is ill-defined. A 12 x 17 mm ovoid lesion in the superior
aspect of the uterus may represent portion of the endometrium or a
necrotic fibroid. There is soft tissue density in the region of the
right adnexa contiguous with the soft tissues along the posterior
uterus.

Other: There is extensive mesenteric and omental nodularity
consistent with implants. There is thickening of the wall of the
descending and sigmoid colon most consistent with serosal implants.
There is a small amount of ascites likely a malignant ascites. No
free air.

Musculoskeletal: There is degenerative changes of the spine. No
acute osseous pathology.
IMPRESSION: 1. Extensive peritoneal and omental implants as well as small
malignant ascites consistent with metastatic disease, likely of
ovarian primary. Sampling of the ascitic fluid may provide
diagnostic cytology. There is serosal implants primarily on the
distal colon.
2. Complex cystic lesion arising from the left ovary with
ill-defined soft tissue mass posterior to the uterus extending to
the right ovary concerning for a neoplastic process.
3. A 3.1 x 3.0 cm cystic lesion arising from the inferior surface of
the right lobe of the liver which may represent a cyst or a cystic
metastasis.
4. No bowel obstruction.
5. Small left pleural effusion.

## 2020-07-20 IMAGING — CT CT CHEST W/ CM
1 series · 15 of 34 positions shown, 19 images · IV contrast (iopamidol)
Comparison: Chest x-ray 09/02/2018

CLINICAL DATA: Shortness of breath. Recent pneumonia with
persistent left lung nodule.

EXAM:
CT CHEST WITH CONTRAST
TECHNIQUE: Multidetector CT imaging of the chest was performed during
intravenous contrast administration.
CONTRAST:  75mL 4O1IUT-JVV IOPAMIDOL (4O1IUT-JVV) INJECTION 61%

[Series 2: chest w/cm · axial · 0.76mm/px · z∈[-284,-24]mm · 15 of 154 slices shown, 19 images]
[im 12/154  mediastinal]
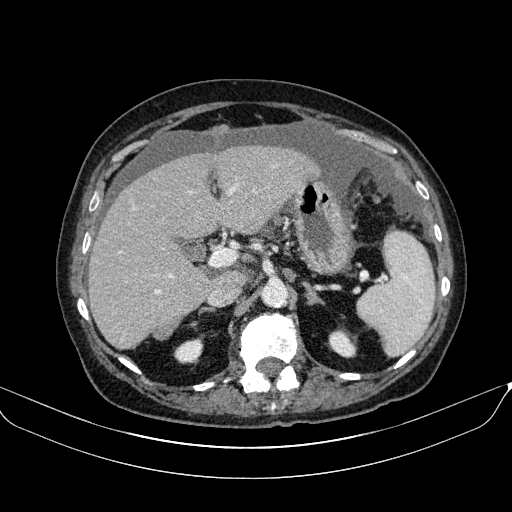
[im 12/154  lung]
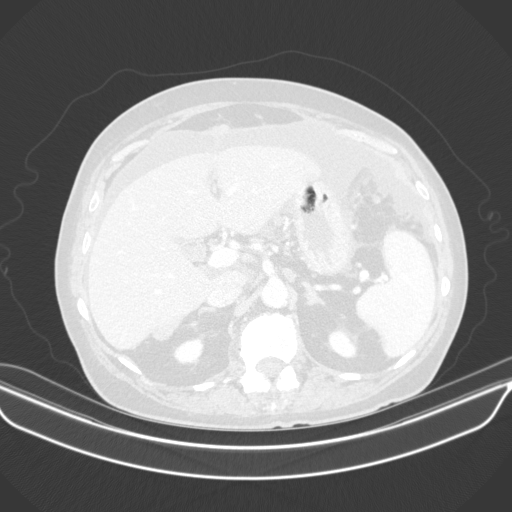
[im 23/154  lung]
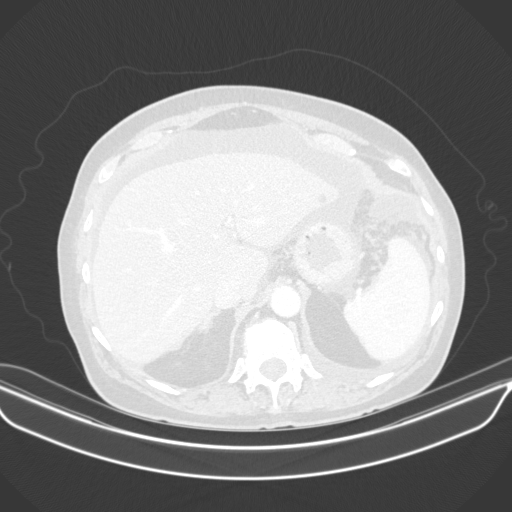
[im 31/154  lung]
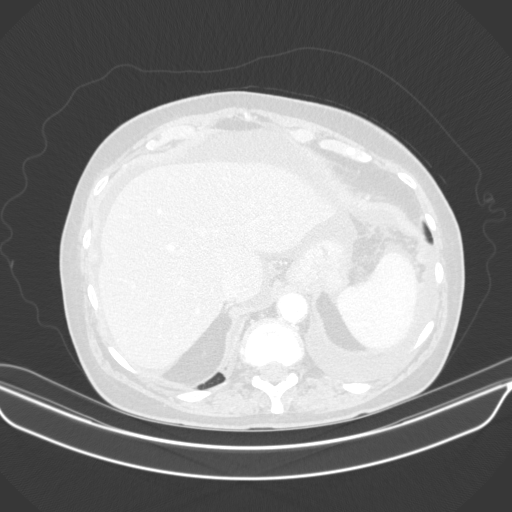
[im 40/154  lung]
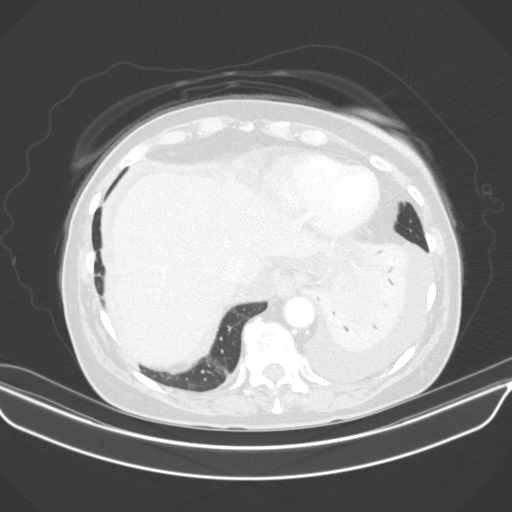
[im 52/154  mediastinal]
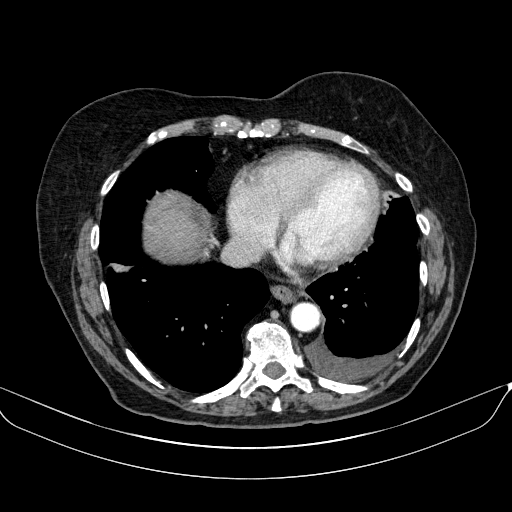
[im 52/154  lung]
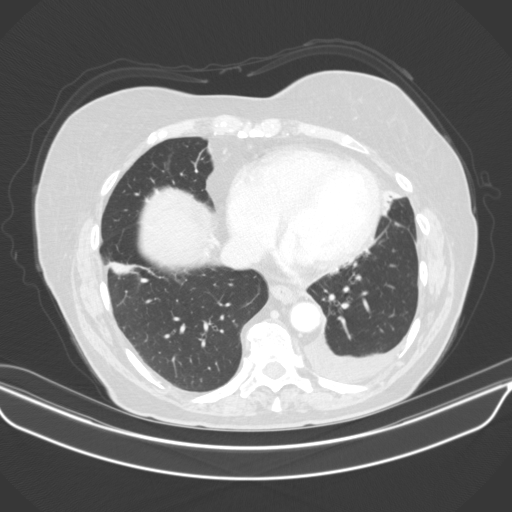
[im 62/154  lung]
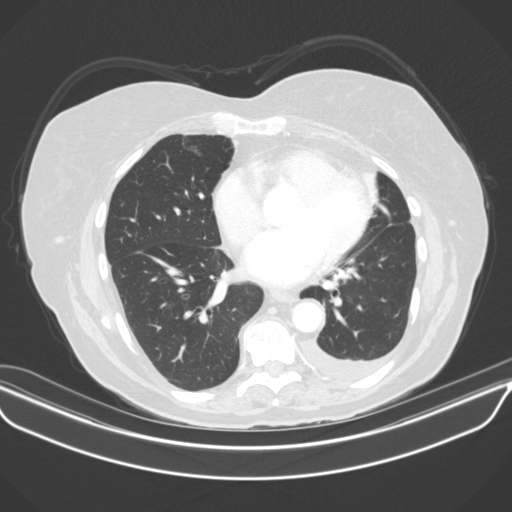
[im 69/154  lung]
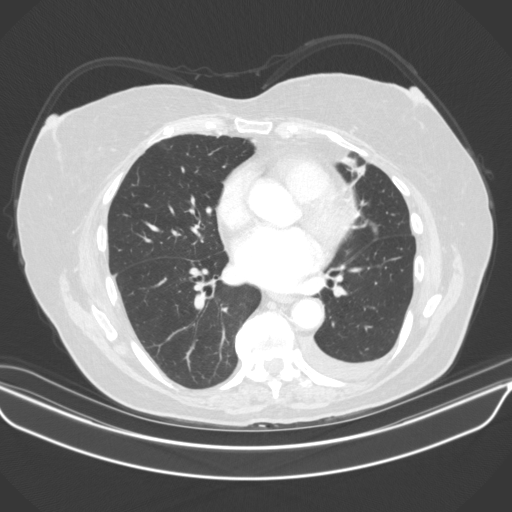
[im 80/154  lung]
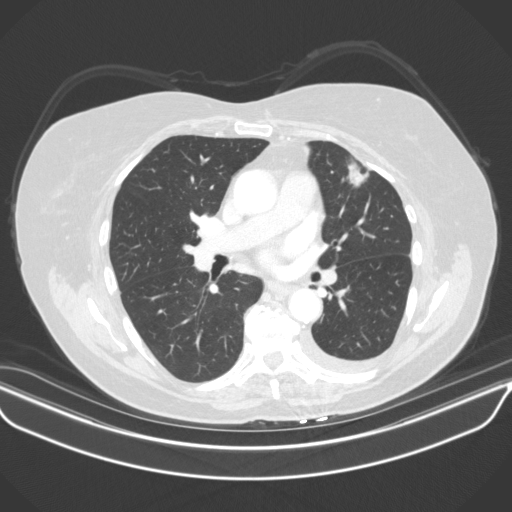
[im 86/154  mediastinal]
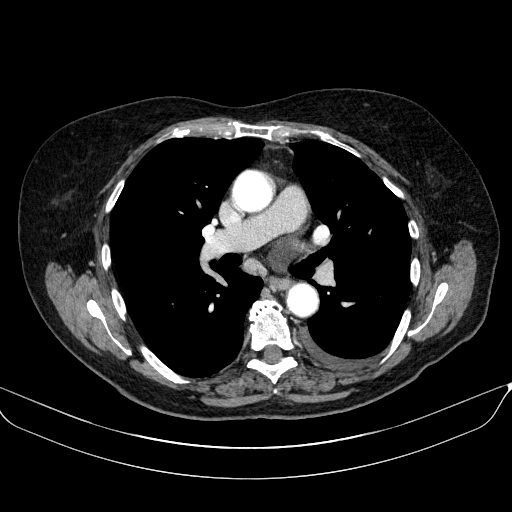
[im 86/154  lung]
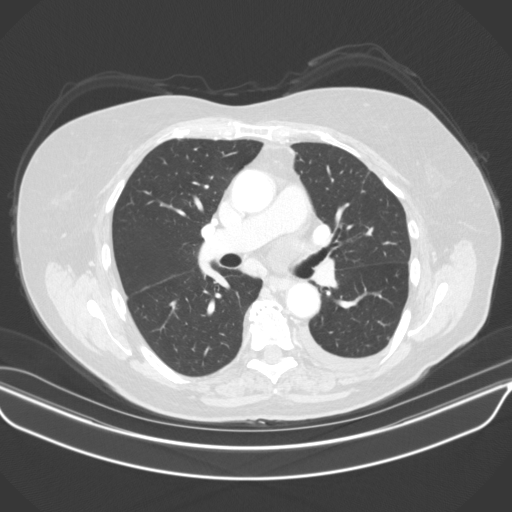
[im 92/154  lung]
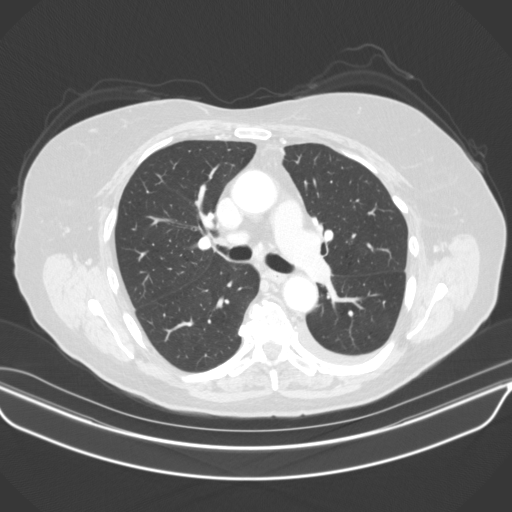
[im 103/154  lung]
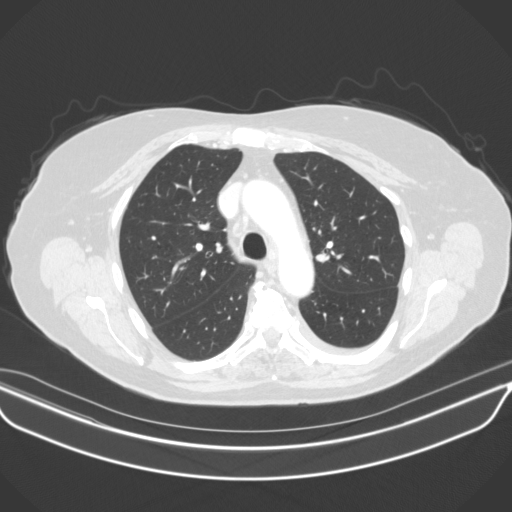
[im 114/154  lung]
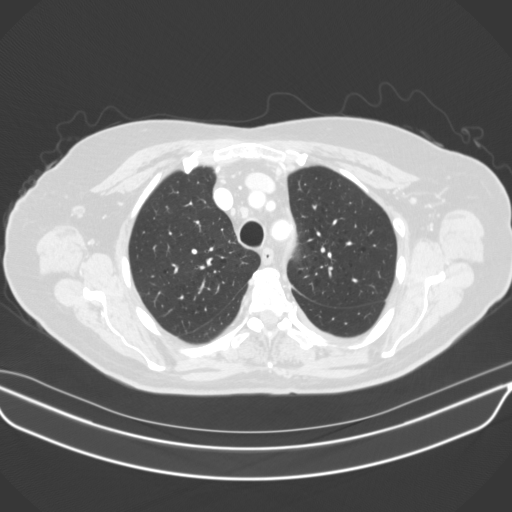
[im 123/154  mediastinal]
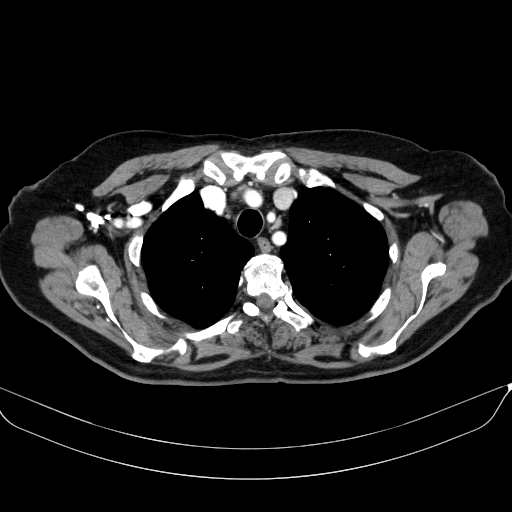
[im 123/154  lung]
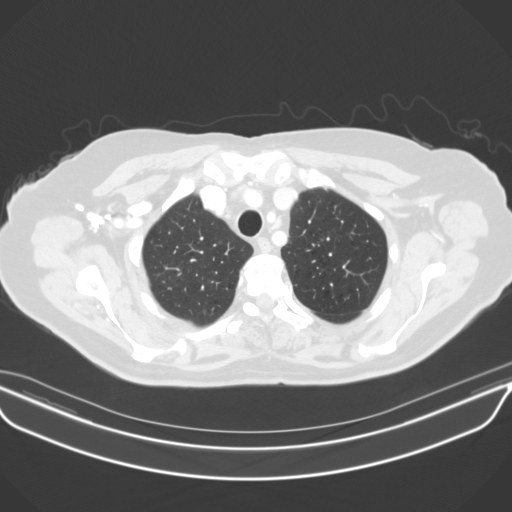
[im 131/154  lung]
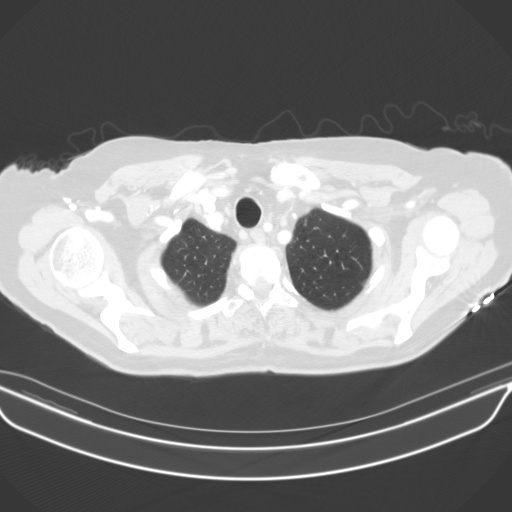
[im 142/154  lung]
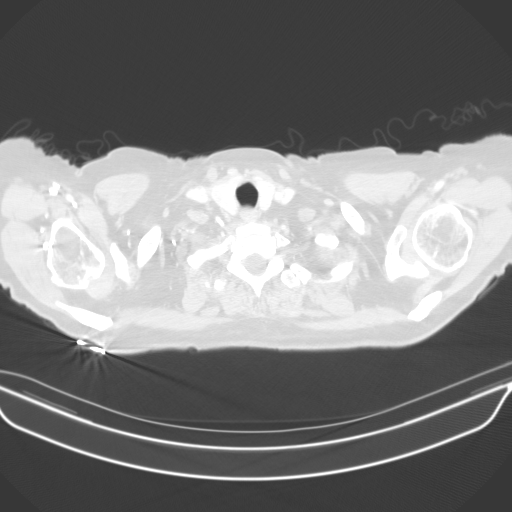

[15 of 34 positions shown; findings below may reference images not displayed]

FINDINGS: Cardiovascular: Heart size upper normal. No pericardial effusion.
Coronary artery calcification is evident. Atherosclerotic
calcification is noted in the wall of the thoracic aorta.

Mediastinum/Nodes: 10 mm short axis subcarinal lymph node is upper
normal for size. Other scattered normal lymph nodes are seen in the
mediastinum. There is no hilar lymphadenopathy. The esophagus has
normal imaging features. There is no axillary lymphadenopathy.

Lungs/Pleura: The central tracheobronchial airways are patent.
Centrilobular emphysema evident. 2 mm right middle lobe nodule
visible on 98/5. Subsegmental atelectasis noted right lower lobe.
1.4 x 2.0 cm irregular left upper lobe nodule identified on 75/5.
Scarring noted within the lingula with left lower lobe
collapse/consolidation. There is small left pleural effusion
associated.

Upper Abdomen: Loculated fluid is identified in the upper abdomen
with soft tissue nodularity along the falciform ligament and
anterior peritoneum. Nodular soft tissue attenuation is seen in the
omentum and perigastric fat.

Musculoskeletal: No worrisome lytic or sclerotic osseous
abnormality.
IMPRESSION: 1. Small left pleural effusion with 1.4 x 2.0 cm nodular opacity in
the left upper lobe. This nodule may be atelectatic, but neoplasm
not excluded.
2. Ascites in the upper abdomen with areas that appear loculated.
This is associated with nodular soft tissue in the omentum and
gastrocolic ligament and nodularity along the falciform ligament.
Imaging features highly concerning for carcinomatosis. Dedicated
abdomen and pelvis CT with oral and intravenous contrast material
recommended to further evaluate.
3.  Aortic Atherosclerois ([id number]-170.0)

I discussed these findings by telephone with Harvinder Alfaro, PA who
was covering for Dr. Tiger at approximately 6498 hours on
09/06/2018
